# Patient Record
Sex: Male | Born: 1945 | Race: White | Hispanic: No | Marital: Married | Smoking: Never smoker
Health system: Southern US, Community
[De-identification: ages and names within clinical notes are randomized; demographics above are authoritative.]

## PROBLEM LIST (undated history)

## (undated) DIAGNOSIS — M1990 Unspecified osteoarthritis, unspecified site: Secondary | ICD-10-CM

## (undated) DIAGNOSIS — N2889 Other specified disorders of kidney and ureter: Secondary | ICD-10-CM

## (undated) DIAGNOSIS — K219 Gastro-esophageal reflux disease without esophagitis: Secondary | ICD-10-CM

## (undated) HISTORY — PX: JOINT REPLACEMENT: SHX530

---

## 1990-10-23 HISTORY — PX: EYE SURGERY: SHX253

## 1999-12-05 ENCOUNTER — Other Ambulatory Visit: Admission: RE | Admit: 1999-12-05 | Discharge: 1999-12-05 | Payer: Self-pay | Admitting: Orthopedic Surgery

## 2000-05-30 ENCOUNTER — Ambulatory Visit (HOSPITAL_BASED_OUTPATIENT_CLINIC_OR_DEPARTMENT_OTHER): Admission: RE | Admit: 2000-05-30 | Discharge: 2000-05-30 | Payer: Self-pay | Admitting: Orthopedic Surgery

## 2000-05-30 HISTORY — PX: OLECRANON BURSECTOMY: SHX2097

## 2000-07-11 ENCOUNTER — Ambulatory Visit (HOSPITAL_BASED_OUTPATIENT_CLINIC_OR_DEPARTMENT_OTHER): Admission: RE | Admit: 2000-07-11 | Discharge: 2000-07-11 | Payer: Self-pay | Admitting: Orthopedic Surgery

## 2000-07-11 ENCOUNTER — Encounter (INDEPENDENT_AMBULATORY_CARE_PROVIDER_SITE_OTHER): Payer: Self-pay | Admitting: *Deleted

## 2000-07-11 HISTORY — PX: MASS EXCISION: SHX2000

## 2001-06-21 ENCOUNTER — Encounter: Payer: Self-pay | Admitting: Orthopedic Surgery

## 2001-06-21 ENCOUNTER — Encounter: Admission: RE | Admit: 2001-06-21 | Discharge: 2001-06-21 | Payer: Self-pay | Admitting: Orthopedic Surgery

## 2002-04-10 ENCOUNTER — Encounter: Payer: Self-pay | Admitting: Orthopedic Surgery

## 2002-04-14 ENCOUNTER — Inpatient Hospital Stay (HOSPITAL_COMMUNITY): Admission: RE | Admit: 2002-04-14 | Discharge: 2002-04-18 | Payer: Self-pay | Admitting: Orthopedic Surgery

## 2002-04-14 ENCOUNTER — Encounter: Payer: Self-pay | Admitting: Orthopedic Surgery

## 2009-04-21 ENCOUNTER — Inpatient Hospital Stay (HOSPITAL_COMMUNITY): Admission: RE | Admit: 2009-04-21 | Discharge: 2009-04-23 | Payer: Self-pay | Admitting: Orthopedic Surgery

## 2011-01-30 LAB — BASIC METABOLIC PANEL
BUN: 10 mg/dL (ref 6–23)
BUN: 11 mg/dL (ref 6–23)
CO2: 30 mEq/L (ref 19–32)
Calcium: 7.8 mg/dL — ABNORMAL LOW (ref 8.4–10.5)
Chloride: 102 mEq/L (ref 96–112)
Creatinine, Ser: 1.05 mg/dL (ref 0.4–1.5)
Creatinine, Ser: 1.09 mg/dL (ref 0.4–1.5)
Glucose, Bld: 164 mg/dL — ABNORMAL HIGH (ref 70–99)

## 2011-01-30 LAB — URINALYSIS, ROUTINE W REFLEX MICROSCOPIC
Glucose, UA: NEGATIVE mg/dL
Nitrite: NEGATIVE
pH: 7.5 (ref 5.0–8.0)

## 2011-01-30 LAB — COMPREHENSIVE METABOLIC PANEL
ALT: 19 U/L (ref 0–53)
AST: 22 U/L (ref 0–37)
Albumin: 3.7 g/dL (ref 3.5–5.2)
Calcium: 9.2 mg/dL (ref 8.4–10.5)
GFR calc Af Amer: >60 mL/min (ref >60)
Sodium: 140 mEq/L (ref 135–145)
Total Protein: 6.6 g/dL (ref 6.0–8.3)

## 2011-01-30 LAB — PROTIME-INR
INR: 1.4 (ref 0.00–1.49)
Prothrombin Time: 17.3 seconds — ABNORMAL HIGH (ref 11.6–15.2)
Prothrombin Time: 21.8 seconds — ABNORMAL HIGH (ref 11.6–15.2)

## 2011-01-30 LAB — TYPE AND SCREEN: ABO/RH(D): B POS

## 2011-01-30 LAB — CBC
Hemoglobin: 12 g/dL — ABNORMAL LOW (ref 13.0–17.0)
MCHC: 34.7 g/dL (ref 30.0–36.0)
MCHC: 34.8 g/dL (ref 30.0–36.0)
MCHC: 35.7 g/dL (ref 30.0–36.0)
MCV: 95.6 fL (ref 78.0–100.0)
Platelets: 132 10*3/uL — ABNORMAL LOW (ref 150–400)
Platelets: 150 10*3/uL (ref 150–400)
RBC: 4.68 MIL/uL (ref 4.22–5.81)
RDW: 12.4 % (ref 11.5–15.5)
RDW: 12.5 % (ref 11.5–15.5)
WBC: 9.4 10*3/uL (ref 4.0–10.5)

## 2011-01-30 LAB — ABO/RH: ABO/RH(D): B POS

## 2011-03-07 NOTE — Op Note (Signed)
[name], [name] NO.:  [id number]   MEDICAL RECORD NO.:  [id number]          PATIENT TYPE:  INP   LOCATION:  5022                         FACILITY:  [facility]   PHYSICIAN:  Loreta Ave, M.D. DATE OF BIRTH:  November 07, 1945   DATE OF PROCEDURE:  04/21/2009  DATE OF DISCHARGE:                               OPERATIVE REPORT   PREOPERATIVE DIAGNOSIS:  Underlying rheumatoid arthritis with end-stage  arthritis, left hip.   POSTOPERATIVE DIAGNOSIS:  Underlying rheumatoid arthritis with end-stage  arthritis, left hip with marked residual synovitis and loose bodies.   PROCEDURE:  Left total hip replacement utilizing Stryker prosthesis.  A  58-mm acetabular component screw fixation x2, ceramic liner, 38-mm  internal diameter.  A Press-Fit #8 rejuvenate stem.  A 34-mm neck with 8  degrees of anteversion.  A 36-mm +5 ceramic head.   SURGEON:  Loreta Ave, MD   ASSISTANT:  Genene Churn. Barry Dienes, [place], present throughout the entire case  necessary for timely completion of procedure.   ANESTHESIA:  General.   BLOOD LOSS:  500 mL.   BLOOD GIVEN:  None.   SPECIMENS:  None.   CULTURES:  None.   COMPLICATIONS:  None.   DRESSING:  Soft compressive with abduction pillow.   PROCEDURE:  The patient was brought to the operating room and placed on  the operating table in the supine position.  After adequate anesthesia  had been obtained, turned to a lateral position.  Appropriate padding  support.  Prepped and draped in the usual sterile fashion.  About a  centimeter short on the left compared right from wear.  He already had a  hip replacement on the opposite side.  After being prepped and draped in  the usual sterile fashion, lateral approach incision along the femur  extending posterosuperior.  Skin and subcutaneous tissue were divided.  Iliotibial band was exposed and incised.  Charnley retractor was put in  place.  External rotator capsule was taken down off the back of  the  intertrochanteric groove of the femur.  Marked vascular hypertrophic  synovitis, Rice bodies, inflammatory tissue throughout the entire hip.  After the cast was taken down, debris was cleared out and the hip  dislocated posteriorly.  Marked grade 4 changes.  Femoral neck cut 1  fingerbreadth above the lesser trochanter in line with the definitive  component.  Acetabulum was exposed.  Grade 4 change throughout.  Appropriate reaming up to good bleeding bone, sufficient inferomedial  placement.  The natural acetabulum was tended to be tilted with very  little anteversion.  This was incised and fitted with a 58-mm component,  restoring as much anteversion as possible about 15 degrees with 45  degrees of abduction.  Sufficient covering stability and fixation with a  58-mm cup.  After it was seated, it was augmented fixation with 2 screws  through the cup.  Fitted with a 36-mm internal diameter ceramic liner.  Attention was turned to the femur.  Good fitting, sizing, and capturing  with the #8 rejuvenate stem.  After appropriate trials, the definitive  stem was seated.  With the 34-mm neck at 8 degrees of anteversion and  the 36-mm +5 head, I had excellent equalization of leg lengths with good  stability in flexion/extension.  Once the hip was reduced, I thoroughly  irrigated.  External rotator and capsule were repaired of the back of  the intertrochanteric groove with FiberWire, tied over bony bridge.  Wound was irrigated.  Iliotibial band was closed after Charnley  retractor was removed.  Skin and subcutaneous tissue were closed with  Vicryl and staples.  Margins were injected with Marcaine.  Sterile  compressive dressing was applied.  Returned in supine position.  Anesthesia was reversed.  Abduction pillow was placed.  Brought to the  recovery room.  Tolerated the surgery well.  No complications.      Loreta Ave, M.D.  Electronically Signed     DFM/MEDQ  D:  04/21/2009  T:   04/22/2009  Job:  [id number]

## 2011-03-10 NOTE — Op Note (Signed)
[place]. [facility]  Patient:    Adam [name], Adam [name] Visit Number: [id number] MRN: [id number]          Service Type: SUR Location: [id number] Attending Physician:  Colbert Ewing Dictated by:   Loreta Ave, M.D. Proc. Date: 04/14/02 Admit Date:  04/14/2002                             Operative Report  PREOPERATIVE DIAGNOSIS:  Degenerative arthritis of right hip.  POSTOPERATIVE DIAGNOSIS:  Degenerative arthritis of right hip.  OPERATIVE PROCEDURE:  Right total hip replacement utilizing Osteonics prosthesis, 58 mm acetabular component with a 36 mm internal diameter ceramic liner, screw fixation on the cuff x2, #9 femoral component, Secur-Fit Plus-type with 13 mm distal stem, 36 mm +0 femoral head, size G.  SURGEON:  Loreta Ave, M.D.  ASSISTANT:  Arlys John D. Petrarca, P.A.-C.  ANESTHESIA:  General.  ESTIMATED BLOOD LOSS:  400 cc.  BLOOD GIVEN:  None.  SPECIMENS:  Excised bone and soft tissue.  CULTURES:  None.  COMPLICATIONS:  None.  DRESSING:  Self-compressive with abduction pillow.  DESCRIPTION OF PROCEDURE:  The patient was brought to the operating room and placed on the operating table in the supine position.  After adequate anesthesia had been obtained, turned to the left lateral position with the left side up.  Prepped and draped in the usual sterile fashion.  A longitudinal incision over the shaft of the femur, extending posterior-superior off the trochanter.  Skin and subcutaneous tissue divided. Hemostasis with cautery.  The iliotibial band incised.  Charnley retractor put in place.  Sciatic nerve identified and protected.  External rotator and capsule taken down off the back of the intertrochanteric groove on the femur and tagged with a #1 Ethibond.  Hip dislocated posteriorly.  Grade 4 changes throughout.  Femoral head removed with a saw in line with the definitive prosthesis, one fingerbreadth above the lesser  trochanter.  Retractors placed. Stab wound exposed.  Soft tissue debrided clear.  Grade 4 changes.  Sequential reaming up to a nice bleeding bony base with appropriate medial and inferior placement.  Sized for a 58 mm component.  A 58 mm component was hammered in place, setting 45 degrees of abduction and 20 degrees of anteversion.  Screw fixation x2, 20 mm above and 16 mm posterosuperior and placed through the cuff.  The ceramic size G, 58 mm external diameter, and 36 mm internal diameter ceramic insert was placed in the acetabulum, and firmly screwed in place.  External alignment and fixation.  Femur exposed.  Sequential reaming proximal and distal with the hand-held and proximal reamers and broaches. Sized to a #9 component with a 13 mm distal stem.  Trials were then undertaken with that size component.  With the femoral head in place, the optimal position was with a #5, 36 mm size G head.  Had excellent stability in flexion and extension.  Good restoration of leg lengths and without undue tightness. All trials were removed.  Definitive component was then opened and hammered down and placed in the femur with a #9 Secur-Fit Plus 13 mm distal stem.  The +5 ceramic 36 mm head was then attached to the femoral component and the hip reduced.  Excellent stability.  Good motion, flexion and extension.  Good restoration of leg lengths.  The wound was irrigated.  External rotator and capsule were repaired on the back of the  intertrochanteric groove with sutures passed through bony tunnels and tied over a bony bridge to restore the posterior wall of the hip.  The wound irrigated.  Sciatic nerve identified and was intact.  Charnley retractor was removed.  The iliotibial band closed with #1 Vicryl, the skin and subcutaneous tissue with Vicryl and staples.  Margins of the wound injected with Marcaine.  A sterile compressive dressing applied. Returned to the supine position.  Abduction pillow placed.   Anesthesia reversed.  Brought to the recovery room.  Tolerated the surgery well with no complications. Dictated by:   Loreta Ave, M.D. Attending Physician:  Colbert Ewing DD:  04/16/02 TD:  04/18/02 Job: [id number]

## 2011-03-10 NOTE — Discharge Summary (Signed)
NAME:  Adam [name], Adam [name] NO.:  [id number]   MEDICAL RECORD NO.:  [id number]                   PATIENT TYPE:  NP   LOCATION:  5012                                 FACILITY:  [facility]   PHYSICIAN:  Loreta Ave, M.D.              DATE OF BIRTH:  08/21/1946   DATE OF ADMISSION:  04/14/2002  DATE OF DISCHARGE:  04/18/2002                                 DISCHARGE SUMMARY   ADMISSION DIAGNOSIS:  Advanced degenerative joint disease of right hip.   DISCHARGE DIAGNOSIS:  Advanced degenerative joint disease of right hip.   PROCEDURE:  Right total hip replacement.   HISTORY OF PRESENT ILLNESS:  A 65 year old male with history of gradual  onset of right hip pain.  He does have a history of rheumatoid arthritis  treated with prednisone and then sulfasalazine and finally off to Advil  only.  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  He has  had progressive mechanical symptoms in the right hip, now showing end stage  DJD, indicated for right total hip replacement.   HOSPITAL COURSE:  A 65 year old male admitted April 14, 2002, after  appropriate laboratory studies were obtained, as well as 1 gm of Ancef IV on  call to the operating room and was taken to the operating room where he  underwent a right total hip replacement.  He tolerated the procedure well.  The patient was with a Dilaudid PCA pump postoperatively.  Heparin 5,000  subcu q.12h. until his Coumadin became therapeutic.  Consultations with PT  and OT were ordered.  The patient was allowed to ambulate touch down weight  bearing 5 lbs only on the right with his walker/crutches.  Hemoglobin A1c  was also ordered.  He was brought out of bed to a chair the following day.   On the 24th he had his IV heplocked, and he was weaned to oral pain  medicines.  IV was discontinued on the 25th, had some difficulty with  constipation, but this improved.  He was discharged on the 27th to return  back  to the office in 10 days for followup.  EKG reveals normal sinus  rhythm.  Radiographic studies of June 23 revealed portable right hip showing  good position and alignment of the prosthesis.  Preop hemoglobin was 15.3,  hematocrit 44.8%, white count 6,700, platelets 123,000.  Discharge  hemoglobin 12.5, hematocrit 36.2%.   Preop chemistry revealed a sodium 139, potassium 4.4, chloride 104, CO2 of  25, glucose 145, BUN 18, creatinine 1.1, calcium 9.3, total protein 6.9,  albumin 3.5, AST 22, ALT 18, ALP 66, and total bilirubin 1.1.  Discharge  sodium 137, potassium 4.0, chloride 100, CO2 of 30, glucose 112, BUN 11,  creatinine 0.9, calcium 8.4, glycosylated hemoglobin 5.1 and normal.  Urinalysis was benign.  Urine blood type B+, antibody screen negative.   DISCHARGE INSTRUCTIONS:  Given a prescription  of Percocet 5/325 one to two  tabs q.4h. p.r.n. pain, Coumadin as per Genevieve Norlander pharmacist, Colace 100 mg  twice a day, iron sulfate 325 mg daily.  He is allowed to be up as taught in  physical therapy.  No restrictions in diet at this time, keep his wound  clean and dry, may shower and  change his dressing daily, call if he has any redness, drainage from the  incision, call if any temperature elevation above 101 degrees.  He will  follow back up with the doctor in 10 days in the office for staple removal.  Discharged in improved condition.     Oris Drone Petrarca, P.A.-C.                Loreta Ave, M.D.    BDP/MEDQ  D:  05/17/2002  T:  05/25/2002  Job:  [id number]   cc:   Loreta Ave, M.D.

## 2011-03-10 NOTE — Op Note (Signed)
[place]. [facility]  Patient:    Adam [name], Adam [name]                     MRN: [id number] Proc. Date: 05/30/00 Adm. Date:  [id number] Attending:  Marlowe Shores                           Operative Report  PREOPERATIVE DIAGNOSIS:  Chronic olecranon bursitis, right elbow.  POSTOPERATIVE DIAGNOSIS:  Chronic olecranon bursitis, right elbow.  PROCEDURE:  Excision of chronic olecranon bursitis, right elbow.  SURGEON:  Artist Pais. Mina Marble, M.D.  ANESTHESIA:  General.  TOURNIQUET TIME:  28 minutes.  COMPLICATIONS:  None.  DRAINS:  None.  DESCRIPTION OF PROCEDURE:  The patient was taken to the operating room.  After the induction of adequate general anesthesia, the right upper extremity was prepped and draped in the usual sterile fashion.  An Esmarch was used to exsanguinate the limb and a tourniquet was inflated to 250 mmHg.  At this point in time, a 4.5 to 5 cm incision was made over the olecranon process of the right elbow.  The incision was taken down through the skin and subcutaneous tissues until large, chronically inflamed bursa was identified. The bursa was carefully dissected free from the underlying subcutaneous tissues using a #15 blade and traced down to an origin overlying the olecranon itself.  The bursa was removed in its entirety and sent for pathologic confirmation.  There were some small rheumatoid type nodules surrounding in the soft tissues.  These were carefully excised and sent for pathologic confirmation, as well.  The wound was then thoroughly irrigated.  Hemostasis was achieved with bipolar cautery and was then closed with 4-0 nylon in horizontal mattress sutures x 7.  A sterile dressing of Xeroform, 4 x 4 fluffs, Kling and compressive type dressing was applied.  The patient tolerated the procedure well and went to the recovery room in stable. DD:  05/30/00 TD:  05/30/00 Job: [id number]

## 2011-03-10 NOTE — Op Note (Signed)
[place]. [facility]  Patient:    Adam [name], Adam [name]                     MRN: [id number] Proc. Date: 07/11/00 Adm. Date:  [id number] Attending:  Marlowe Shores CC:         Artist Pais Mina Marble, M.D.  (Office, 2)                           Operative Report  PREOPERATIVE DIAGNOSIS:  Mass, left elbow, olecranon area.  POSTOPERATIVE DIAGNOSIS:  Mass, left elbow, olecranon area.  OPERATION PERFORMED:  Excisional biopsy, mass, left elbow.  SURGEON:  Artist Pais. Mina Marble, M.D.  ASSISTANT:  Junius Roads. [place], P.A.C.  ANESTHESIA:  General.  TOURNIQUET TIME:  Thirty minutes.  COMPLICATIONS:  None.  DRAINS:  None.  SPECIMEN:  One specimen sent.  DESCRIPTION OF PROCEDURE:  The patient was brought to the operating room. After the induction of adequate general anesthesia the left upper extremity was prepped and draped in the usual sterile fashion.  An Esmarch was used to exsanguinate the limb.  Tourniquet was inflated to 250 mmHg.  At this point in time a longitudinal incision was made over the olecranon fossa of the left elbow.  The incision was taken down through the skin and subcutaneous tissues until the mass was identified.  The mass was carefully dissected down to its stalk, which appeared to be coming from the proximal ulna.  This was removed in its entirety and sent for pathologic confirmation.  The wound was then thoroughly irrigated and closed with a 4-0 nylon in a combination of simple and horizontal mattress sutures.  A sterile dressing of Xeroform, 4 x 4s, fluffs and a compressive dressing was applied.  The patient tolerated the procedure well and went to recovery room in stable fashion. DD:  07/11/00 TD:  07/11/00 Job: [id number]

## 2018-02-01 ENCOUNTER — Other Ambulatory Visit: Payer: Self-pay | Admitting: Urology

## 2018-02-13 ENCOUNTER — Encounter (HOSPITAL_COMMUNITY): Payer: Self-pay

## 2018-02-13 NOTE — Patient Instructions (Signed)
Adam [name]  02/13/2018   Your procedure is scheduled on: 02-15-18   Report to [facility] Main  Entrance    Report to admitting at 5:30AM    Call this number if you have problems the morning of surgery [phone]     Remember: Do not eat food or drink liquids :After Midnight.     Take these medicines the morning of surgery with A SIP OF WATER: NONE                                  You may not have any metal on your body including hair pins and              piercings  Do not wear jewelry, make-up, lotions, powders or perfumes, deodorant             Do not wear nail polish.  Do not shave  48 hours prior to surgery.              Men may shave face and neck.   Do not bring valuables to the hospital. [place].  Contacts, dentures or bridgework may not be worn into surgery.  Leave suitcase in the car. After surgery it may be brought to your room.                 Please read over the following fact sheets you were given: _____________________________________________________________________             [facility] - Preparing for Surgery Before surgery, you can play an important role.  Because skin is not sterile, your skin needs to be as free of germs as possible.  You can reduce the number of germs on your skin by washing with CHG (chlorahexidine gluconate) soap before surgery.  CHG is an antiseptic cleaner which kills germs and bonds with the skin to continue killing germs even after washing. Please DO NOT use if you have an allergy to CHG or antibacterial soaps.  If your skin becomes reddened/irritated stop using the CHG and inform your nurse when you arrive at Short Stay. Do not shave (including legs and underarms) for at least 48 hours prior to the first CHG shower.  You may shave your face/neck. Please follow these instructions carefully:  1.  Shower with CHG Soap the night before surgery and the   morning of Surgery.  2.  If you choose to wash your hair, wash your hair first as usual with your  normal  shampoo.  3.  After you shampoo, rinse your hair and body thoroughly to remove the  shampoo.                           4.  Use CHG as you would any other liquid soap.  You can apply chg directly  to the skin and wash                       Gently with a scrungie or clean washcloth.  5.  Apply the CHG Soap to your body ONLY FROM THE NECK DOWN.   Do not use on face/ open  Wound or open sores. Avoid contact with eyes, ears mouth and genitals (private parts).                       Wash face,  Genitals (private parts) with your normal soap.             6.  Wash thoroughly, paying special attention to the area where your surgery  will be performed.  7.  Thoroughly rinse your body with warm water from the neck down.  8.  DO NOT shower/wash with your normal soap after using and rinsing off  the CHG Soap.                9.  Pat yourself dry with a clean towel.            10.  Wear clean pajamas.            11.  Place clean sheets on your bed the night of your first shower and do not  sleep with pets. Day of Surgery : Do not apply any lotions/deodorants the morning of surgery.  Please wear clean clothes to the hospital/surgery center.  FAILURE TO FOLLOW THESE INSTRUCTIONS MAY RESULT IN THE CANCELLATION OF YOUR SURGERY PATIENT SIGNATURE_________________________________  NURSE SIGNATURE__________________________________  ________________________________________________________________________  WHAT IS A BLOOD TRANSFUSION? Blood Transfusion Information  A transfusion is the replacement of blood or some of its parts. Blood is made up of multiple cells which provide different functions.  Red blood cells carry oxygen and are used for blood loss replacement.  White blood cells fight against infection.  Platelets control bleeding.  Plasma helps clot blood.  Other blood  products are available for specialized needs, such as hemophilia or other clotting disorders. BEFORE THE TRANSFUSION  Who gives blood for transfusions?   Healthy volunteers who are fully evaluated to make sure their blood is safe. This is blood bank blood. Transfusion therapy is the safest it has ever been in the practice of medicine. Before blood is taken from a donor, a complete history is taken to make sure that person has no history of diseases nor engages in risky social behavior (examples are intravenous drug use or sexual activity with multiple partners). The donor's travel history is screened to minimize risk of transmitting infections, such as malaria. The donated blood is tested for signs of infectious diseases, such as HIV and hepatitis. The blood is then tested to be sure it is compatible with you in order to minimize the chance of a transfusion reaction. If you or a relative donates blood, this is often done in anticipation of surgery and is not appropriate for emergency situations. It takes many days to process the donated blood. RISKS AND COMPLICATIONS Although transfusion therapy is very safe and saves many lives, the main dangers of transfusion include:   Getting an infectious disease.  Developing a transfusion reaction. This is an allergic reaction to something in the blood you were given. Every precaution is taken to prevent this. The decision to have a blood transfusion has been considered carefully by your caregiver before blood is given. Blood is not given unless the benefits outweigh the risks. AFTER THE TRANSFUSION  Right after receiving a blood transfusion, you will usually feel much better and more energetic. This is especially true if your red blood cells have gotten low (anemic). The transfusion raises the level of the red blood cells which carry oxygen, and this usually causes an energy increase.  The  nurse administering the transfusion will monitor you carefully for  complications. HOME CARE INSTRUCTIONS  No special instructions are needed after a transfusion. You may find your energy is better. Speak with your caregiver about any limitations on activity for underlying diseases you may have. SEEK MEDICAL CARE IF:   Your condition is not improving after your transfusion.  You develop redness or irritation at the intravenous (IV) site. SEEK IMMEDIATE MEDICAL CARE IF:  Any of the following symptoms occur over the next 12 hours:  Shaking chills.  You have a temperature by mouth above 102 F (38.9 C), not controlled by medicine.  Chest, back, or muscle pain.  People around you feel you are not acting correctly or are confused.  Shortness of breath or difficulty breathing.  Dizziness and fainting.  You get a rash or develop hives.  You have a decrease in urine output.  Your urine turns a dark color or changes to pink, red, or brown. Any of the following symptoms occur over the next 10 days:  You have a temperature by mouth above 102 F (38.9 C), not controlled by medicine.  Shortness of breath.  Weakness after normal activity.  The white part of the eye turns yellow (jaundice).  You have a decrease in the amount of urine or are urinating less often.  Your urine turns a dark color or changes to pink, red, or brown. Document Released: 10/06/2000 Document Revised: 01/01/2012 Document Reviewed: 05/25/2008 [facility] Patient Information 2014 [place], [place].  _______________________________________________________________________

## 2018-02-14 ENCOUNTER — Encounter (HOSPITAL_COMMUNITY): Payer: Self-pay

## 2018-02-14 ENCOUNTER — Other Ambulatory Visit: Payer: Self-pay

## 2018-02-14 ENCOUNTER — Encounter (HOSPITAL_COMMUNITY)
Admission: RE | Admit: 2018-02-14 | Discharge: 2018-02-14 | Disposition: A | Discharge status: Home / Self Care | Payer: Medicare Other | Source: Ambulatory Visit | Attending: Urology | Admitting: Urology

## 2018-02-14 HISTORY — DX: Gastro-esophageal reflux disease without esophagitis: K21.9

## 2018-02-14 HISTORY — DX: Other specified disorders of kidney and ureter: N28.89

## 2018-02-14 HISTORY — DX: Unspecified osteoarthritis, unspecified site: M19.90

## 2018-02-14 LAB — COMPREHENSIVE METABOLIC PANEL
ALK PHOS: 59 U/L (ref 38–126)
ALT: 22 U/L (ref 17–63)
ANION GAP: 10 (ref 5–15)
AST: 27 U/L (ref 15–41)
Albumin: 4.1 g/dL (ref 3.5–5.0)
BUN: 22 mg/dL — ABNORMAL HIGH (ref 6–20)
CALCIUM: 8.8 mg/dL — AB (ref 8.9–10.3)
CO2: 24 mmol/L (ref 22–32)
CREATININE: 1.38 mg/dL — AB (ref 0.61–1.24)
Chloride: 104 mmol/L (ref 101–111)
GFR, EST AFRICAN AMERICAN: 58 mL/min — AB (ref >60)
GFR, EST NON AFRICAN AMERICAN: 50 mL/min — AB (ref >60)
Glucose, Bld: 101 mg/dL — ABNORMAL HIGH (ref 65–99)
Potassium: 4.4 mmol/L (ref 3.5–5.1)
SODIUM: 138 mmol/L (ref 135–145)
Total Bilirubin: 1.3 mg/dL — ABNORMAL HIGH (ref 0.3–1.2)
Total Protein: 7.4 g/dL (ref 6.5–8.1)

## 2018-02-14 LAB — CBC
HCT: 47 % (ref 39.0–52.0)
Hemoglobin: 16.8 g/dL (ref 13.0–17.0)
MCH: 33.7 pg (ref 26.0–34.0)
MCHC: 35.7 g/dL (ref 30.0–36.0)
MCV: 94.2 fL (ref 78.0–100.0)
PLATELETS: 185 10*3/uL (ref 150–400)
RBC: 4.99 MIL/uL (ref 4.22–5.81)
RDW: 12.7 % (ref 11.5–15.5)
WBC: 8.1 10*3/uL (ref 4.0–10.5)

## 2018-02-15 ENCOUNTER — Encounter (HOSPITAL_COMMUNITY): Payer: Self-pay | Admitting: Emergency Medicine

## 2018-02-15 ENCOUNTER — Encounter (HOSPITAL_COMMUNITY)
Admission: RE | Disposition: A | Discharge status: Home / Self Care | Payer: Self-pay | Source: Ambulatory Visit | Attending: Urology

## 2018-02-15 ENCOUNTER — Inpatient Hospital Stay (HOSPITAL_COMMUNITY): Payer: Medicare Other | Admitting: Certified Registered Nurse Anesthetist

## 2018-02-15 ENCOUNTER — Inpatient Hospital Stay (HOSPITAL_COMMUNITY)
Admission: RE | Admit: 2018-02-15 | Discharge: 2018-02-17 | DRG: 658 | Disposition: A | Discharge status: Home / Self Care | Payer: Medicare Other | Source: Ambulatory Visit | Attending: Urology | Admitting: Urology

## 2018-02-15 DIAGNOSIS — M1990 Unspecified osteoarthritis, unspecified site: Secondary | ICD-10-CM | POA: Diagnosis present

## 2018-02-15 DIAGNOSIS — Z6835 Body mass index (BMI) 35.0-35.9, adult: Secondary | ICD-10-CM

## 2018-02-15 DIAGNOSIS — N2889 Other specified disorders of kidney and ureter: Secondary | ICD-10-CM | POA: Diagnosis present

## 2018-02-15 DIAGNOSIS — E669 Obesity, unspecified: Secondary | ICD-10-CM | POA: Diagnosis present

## 2018-02-15 DIAGNOSIS — D49519 Neoplasm of unspecified behavior of unspecified kidney: Secondary | ICD-10-CM | POA: Diagnosis present

## 2018-02-15 DIAGNOSIS — C641 Malignant neoplasm of right kidney, except renal pelvis: Secondary | ICD-10-CM | POA: Diagnosis present

## 2018-02-15 HISTORY — PX: LAPAROSCOPIC NEPHRECTOMY, HAND ASSISTED: SHX1929

## 2018-02-15 LAB — BASIC METABOLIC PANEL
Anion gap: 10 (ref 5–15)
BUN: 20 mg/dL (ref 6–20)
CALCIUM: 8.2 mg/dL — AB (ref 8.9–10.3)
CHLORIDE: 105 mmol/L (ref 101–111)
CO2: 24 mmol/L (ref 22–32)
CREATININE: 1.54 mg/dL — AB (ref 0.61–1.24)
GFR calc non Af Amer: 44 mL/min — ABNORMAL LOW (ref >60)
GFR, EST AFRICAN AMERICAN: 51 mL/min — AB (ref >60)
Glucose, Bld: 183 mg/dL — ABNORMAL HIGH (ref 65–99)
Potassium: 4.2 mmol/L (ref 3.5–5.1)
SODIUM: 139 mmol/L (ref 135–145)

## 2018-02-15 LAB — CBC
HCT: 43.8 % (ref 39.0–52.0)
Hemoglobin: 15.1 g/dL (ref 13.0–17.0)
MCH: 32.9 pg (ref 26.0–34.0)
MCHC: 34.5 g/dL (ref 30.0–36.0)
MCV: 95.4 fL (ref 78.0–100.0)
PLATELETS: 167 10*3/uL (ref 150–400)
RBC: 4.59 MIL/uL (ref 4.22–5.81)
RDW: 12.8 % (ref 11.5–15.5)
WBC: 13 10*3/uL — AB (ref 4.0–10.5)

## 2018-02-15 LAB — TYPE AND SCREEN
ABO/RH(D): B POS
ANTIBODY SCREEN: NEGATIVE

## 2018-02-15 LAB — ABO/RH: ABO/RH(D): B POS

## 2018-02-15 SURGERY — NEPHRECTOMY, HAND-ASSISTED, LAPAROSCOPIC
Anesthesia: General | Laterality: Right

## 2018-02-15 MED ORDER — FENTANYL CITRATE (PF) 250 MCG/5ML IJ SOLN
INTRAMUSCULAR | Status: AC
  Filled 2018-02-15: qty 5

## 2018-02-15 MED ORDER — DOCUSATE SODIUM 100 MG PO CAPS
100.0000 mg | ORAL_CAPSULE | Freq: Two times a day (BID) | ORAL | Status: DC
  Administered 2018-02-15 – 2018-02-17 (×4): 100 mg via ORAL
  Filled 2018-02-15 (×4): qty 1

## 2018-02-15 MED ORDER — ZOLPIDEM TARTRATE 5 MG PO TABS: 5.0000 mg | ORAL_TABLET | Freq: Every evening | ORAL | Status: DC | PRN

## 2018-02-15 MED ORDER — DEXAMETHASONE SODIUM PHOSPHATE 10 MG/ML IJ SOLN
INTRAMUSCULAR | Status: DC | PRN
  Administered 2018-02-15: 5 mg via INTRAVENOUS

## 2018-02-15 MED ORDER — SUGAMMADEX SODIUM 200 MG/2ML IV SOLN
INTRAVENOUS | Status: DC | PRN
  Administered 2018-02-15: 200 mg via INTRAVENOUS

## 2018-02-15 MED ORDER — SODIUM CHLORIDE 0.9 % IJ SOLN
INTRAMUSCULAR | Status: DC | PRN
  Administered 2018-02-15: 40 mL

## 2018-02-15 MED ORDER — SODIUM CHLORIDE 0.9 % IV SOLN
INTRAVENOUS | Status: DC
  Administered 2018-02-15 – 2018-02-16 (×2): via INTRAVENOUS

## 2018-02-15 MED ORDER — ONDANSETRON HCL 4 MG/2ML IJ SOLN
INTRAMUSCULAR | Status: AC
  Filled 2018-02-15: qty 2

## 2018-02-15 MED ORDER — SODIUM CHLORIDE 0.9 % IJ SOLN
INTRAMUSCULAR | Status: AC
  Filled 2018-02-15: qty 50

## 2018-02-15 MED ORDER — ACETAMINOPHEN 10 MG/ML IV SOLN
1000.0000 mg | Freq: Four times a day (QID) | INTRAVENOUS | Status: AC
  Administered 2018-02-15 – 2018-02-16 (×4): 1000 mg via INTRAVENOUS
  Filled 2018-02-15 (×3): qty 100

## 2018-02-15 MED ORDER — ONDANSETRON HCL 4 MG/2ML IJ SOLN
INTRAMUSCULAR | Status: DC | PRN
  Administered 2018-02-15: 4 mg via INTRAVENOUS

## 2018-02-15 MED ORDER — FENTANYL CITRATE (PF) 100 MCG/2ML IJ SOLN
INTRAMUSCULAR | Status: AC
  Filled 2018-02-15: qty 2

## 2018-02-15 MED ORDER — SUCCINYLCHOLINE CHLORIDE 20 MG/ML IJ SOLN
INTRAMUSCULAR | Status: DC | PRN
Start: 2018-02-15 — End: 2018-02-15
  Administered 2018-02-15: 120 mg via INTRAVENOUS

## 2018-02-15 MED ORDER — ROCURONIUM BROMIDE 10 MG/ML (PF) SYRINGE
PREFILLED_SYRINGE | INTRAVENOUS | Status: DC | PRN
  Administered 2018-02-15: 20 mg via INTRAVENOUS
  Administered 2018-02-15: 10 mg via INTRAVENOUS
  Administered 2018-02-15: 20 mg via INTRAVENOUS
  Administered 2018-02-15: 50 mg via INTRAVENOUS

## 2018-02-15 MED ORDER — ACETAMINOPHEN 500 MG PO TABS
1000.0000 mg | ORAL_TABLET | Freq: Once | ORAL | Status: AC
  Administered 2018-02-15: 1000 mg via ORAL
  Filled 2018-02-15: qty 2

## 2018-02-15 MED ORDER — SUGAMMADEX SODIUM 200 MG/2ML IV SOLN
INTRAVENOUS | Status: AC
  Filled 2018-02-15: qty 2

## 2018-02-15 MED ORDER — KETOROLAC TROMETHAMINE 15 MG/ML IJ SOLN
15.0000 mg | Freq: Four times a day (QID) | INTRAMUSCULAR | Status: AC
  Administered 2018-02-15 (×3): 15 mg via INTRAVENOUS
  Filled 2018-02-15 (×2): qty 1

## 2018-02-15 MED ORDER — ACETAMINOPHEN 10 MG/ML IV SOLN
INTRAVENOUS | Status: AC
  Administered 2018-02-15: 1000 mg via INTRAVENOUS
  Filled 2018-02-15: qty 100

## 2018-02-15 MED ORDER — PROPOFOL 10 MG/ML IV BOLUS
INTRAVENOUS | Status: AC
  Filled 2018-02-15: qty 20

## 2018-02-15 MED ORDER — FENTANYL CITRATE (PF) 100 MCG/2ML IJ SOLN
25.0000 ug | INTRAMUSCULAR | Status: DC | PRN
  Administered 2018-02-15: 50 ug via INTRAVENOUS

## 2018-02-15 MED ORDER — DEXAMETHASONE SODIUM PHOSPHATE 10 MG/ML IJ SOLN
INTRAMUSCULAR | Status: AC
  Filled 2018-02-15: qty 1

## 2018-02-15 MED ORDER — CEFAZOLIN SODIUM-DEXTROSE 2-4 GM/100ML-% IV SOLN
2.0000 g | INTRAVENOUS | Status: AC
  Administered 2018-02-15: 2 g via INTRAVENOUS
  Filled 2018-02-15: qty 100

## 2018-02-15 MED ORDER — ONDANSETRON HCL 4 MG/2ML IJ SOLN: 4.0000 mg | Freq: Once | INTRAMUSCULAR | Status: DC | PRN

## 2018-02-15 MED ORDER — BUPIVACAINE-EPINEPHRINE 0.5% -1:200000 IJ SOLN
INTRAMUSCULAR | Status: DC | PRN
  Administered 2018-02-15: 30 mL

## 2018-02-15 MED ORDER — LACTATED RINGERS IV SOLN
INTRAVENOUS | Status: DC
  Administered 2018-02-15 (×3): via INTRAVENOUS

## 2018-02-15 MED ORDER — DOCUSATE SODIUM 100 MG PO CAPS: 100.0000 mg | ORAL_CAPSULE | Freq: Two times a day (BID) | ORAL | 0 refills | Status: AC

## 2018-02-15 MED ORDER — BUPIVACAINE-EPINEPHRINE (PF) 0.5% -1:200000 IJ SOLN
INTRAMUSCULAR | Status: AC
  Filled 2018-02-15: qty 30

## 2018-02-15 MED ORDER — BUPIVACAINE LIPOSOME 1.3 % IJ SUSP
20.0000 mL | Freq: Once | INTRAMUSCULAR | Status: AC
  Administered 2018-02-15: 20 mL
  Filled 2018-02-15: qty 20

## 2018-02-15 MED ORDER — ONDANSETRON HCL 4 MG/2ML IJ SOLN: 4.0000 mg | Freq: Four times a day (QID) | INTRAMUSCULAR | Status: DC | PRN

## 2018-02-15 MED ORDER — TRAMADOL HCL 50 MG PO TABS
50.0000 mg | ORAL_TABLET | Freq: Four times a day (QID) | ORAL | 0 refills | Status: AC | PRN
Start: 2018-02-15 — End: ?

## 2018-02-15 MED ORDER — HYDROMORPHONE HCL 1 MG/ML IJ SOLN
0.5000 mg | INTRAMUSCULAR | Status: DC | PRN
Start: 2018-02-15 — End: 2018-02-16

## 2018-02-15 MED ORDER — DEXMEDETOMIDINE HCL 200 MCG/2ML IV SOLN
INTRAVENOUS | Status: DC | PRN
  Administered 2018-02-15 (×2): 20 ug via INTRAVENOUS

## 2018-02-15 MED ORDER — LIDOCAINE 2% (20 MG/ML) 5 ML SYRINGE
INTRAMUSCULAR | Status: DC | PRN
  Administered 2018-02-15: 60 mg via INTRAVENOUS

## 2018-02-15 MED ORDER — LACTATED RINGERS IR SOLN
Status: DC | PRN
  Administered 2018-02-15: 1000 mL

## 2018-02-15 MED ORDER — TRAMADOL HCL 50 MG PO TABS
50.0000 mg | ORAL_TABLET | Freq: Four times a day (QID) | ORAL | Status: DC | PRN
  Administered 2018-02-16 – 2018-02-17 (×2): 100 mg via ORAL
  Filled 2018-02-15 (×2): qty 2

## 2018-02-15 MED ORDER — FENTANYL CITRATE (PF) 100 MCG/2ML IJ SOLN
INTRAMUSCULAR | Status: DC | PRN
  Administered 2018-02-15 (×2): 50 ug via INTRAVENOUS
  Administered 2018-02-15: 25 ug via INTRAVENOUS
  Administered 2018-02-15: 50 ug via INTRAVENOUS
  Administered 2018-02-15: 100 ug via INTRAVENOUS

## 2018-02-15 MED ORDER — KETOROLAC TROMETHAMINE 15 MG/ML IJ SOLN
INTRAMUSCULAR | Status: AC
  Administered 2018-02-15: 15 mg via INTRAVENOUS
  Filled 2018-02-15: qty 1

## 2018-02-15 SURGICAL SUPPLY — 59 items
APPLICATOR ARISTA FLEXITIP XL (MISCELLANEOUS) IMPLANT
APPLICATOR SURGIFLO ENDO (HEMOSTASIS) IMPLANT
APPLIER CLIP ROT 10 11.4 M/L (STAPLE)
BAG LAPAROSCOPIC 12 15 PORT 16 (BASKET) IMPLANT
BAG RETRIEVAL 12/15 (BASKET)
BAG ZIPLOCK 12X15 (MISCELLANEOUS) IMPLANT
BLADE EXTENDED COATED 6.5IN (ELECTRODE) ×2 IMPLANT
BLADE SURG SZ10 CARB STEEL (BLADE) ×2 IMPLANT
CHLORAPREP W/TINT 26ML (MISCELLANEOUS) ×2 IMPLANT
CLIP APPLIE ROT 10 11.4 M/L (STAPLE) IMPLANT
CLIP VESOLOCK LG 6/CT PURPLE (CLIP) ×2 IMPLANT
CLIP VESOLOCK MED LG 6/CT (CLIP) ×4 IMPLANT
CLIP VESOLOCK XL 6/CT (CLIP) IMPLANT
CUTTER FLEX LINEAR 45M (STAPLE) ×2 IMPLANT
DECANTER SPIKE VIAL GLASS SM (MISCELLANEOUS) IMPLANT
DERMABOND ADVANCED (GAUZE/BANDAGES/DRESSINGS) ×1
DERMABOND ADVANCED .7 DNX12 (GAUZE/BANDAGES/DRESSINGS) ×1 IMPLANT
DRAPE INCISE IOBAN 66X45 STRL (DRAPES) ×2 IMPLANT
DRAPE WARM FLUID 44X44 (DRAPE) IMPLANT
DRSG TEGADERM 2-3/8X2-3/4 SM (GAUZE/BANDAGES/DRESSINGS) ×6 IMPLANT
DRSG TEGADERM 4X4.75 (GAUZE/BANDAGES/DRESSINGS) ×2 IMPLANT
DRSG TELFA 3X8 NADH (GAUZE/BANDAGES/DRESSINGS) ×2 IMPLANT
ELECT PENCIL ROCKER SW 15FT (MISCELLANEOUS) ×2 IMPLANT
ELECT REM PT RETURN 15FT ADLT (MISCELLANEOUS) ×2 IMPLANT
GLOVE BIOGEL M STRL SZ7.5 (GLOVE) ×2 IMPLANT
GOWN STRL REUS W/TWL LRG LVL3 (GOWN DISPOSABLE) ×4 IMPLANT
HEMOSTAT ARISTA ABSORB 3G PWDR (MISCELLANEOUS) IMPLANT
HEMOSTAT SURGICEL 4X8 (HEMOSTASIS) ×2 IMPLANT
IRRIG SUCT STRYKERFLOW 2 WTIP (MISCELLANEOUS) ×2
IRRIGATION SUCT STRKRFLW 2 WTP (MISCELLANEOUS) ×1 IMPLANT
KIT BASIN OR (CUSTOM PROCEDURE TRAY) ×2 IMPLANT
MANIFOLD NEPTUNE II (INSTRUMENTS) ×2 IMPLANT
NEEDLE SPNL 22GX3.5 QUINCKE BK (NEEDLE) ×2 IMPLANT
PAD POSITIONING PINK XL (MISCELLANEOUS) ×2 IMPLANT
PAD TELFA 2X3 NADH STRL (GAUZE/BANDAGES/DRESSINGS) ×2 IMPLANT
POSITIONER SURGICAL ARM (MISCELLANEOUS) ×4 IMPLANT
RELOAD 45 VASCULAR/THIN (ENDOMECHANICALS) ×4 IMPLANT
RELOAD STAPLE TA45 3.5 REG BLU (ENDOMECHANICALS) IMPLANT
SCISSORS LAP 5X35 DISP (ENDOMECHANICALS) IMPLANT
SHEARS HARMONIC ACE PLUS 36CM (ENDOMECHANICALS) ×2 IMPLANT
SLEEVE XCEL OPT CAN 5 100 (ENDOMECHANICALS) ×2 IMPLANT
SPONGE LAP 4X18 RFD (DISPOSABLE) ×2 IMPLANT
STAPLER VISISTAT 35W (STAPLE) ×2 IMPLANT
SUT MNCRL AB 4-0 PS2 18 (SUTURE) ×4 IMPLANT
SUT PDS AB 0 CT1 36 (SUTURE) ×2 IMPLANT
SUT VIC AB 2-0 CT1 27 (SUTURE) ×1
SUT VIC AB 2-0 CT1 27XBRD (SUTURE) ×1 IMPLANT
SUT VIC AB 3-0 SH 27 (SUTURE) ×1
SUT VIC AB 3-0 SH 27XBRD (SUTURE) ×1 IMPLANT
SUT VICRYL 0 UR6 27IN ABS (SUTURE) ×4 IMPLANT
TAPE CLOTH 4X10 WHT NS (GAUZE/BANDAGES/DRESSINGS) IMPLANT
TOWEL OR 17X26 10 PK STRL BLUE (TOWEL DISPOSABLE) ×2 IMPLANT
TOWEL OR NON WOVEN STRL DISP B (DISPOSABLE) ×2 IMPLANT
TRAY FOLEY CATH 16FR SILVER (SET/KITS/TRAYS/PACK) ×2 IMPLANT
TRAY LAPAROSCOPIC (CUSTOM PROCEDURE TRAY) ×2 IMPLANT
TROCAR BLADELESS OPT 5 100 (ENDOMECHANICALS) ×2 IMPLANT
TROCAR UNIVERSAL OPT 12M 100M (ENDOMECHANICALS) ×2 IMPLANT
TROCAR XCEL 12X100 BLDLESS (ENDOMECHANICALS) ×2 IMPLANT
TUBING INSUF HEATED (TUBING) ×2 IMPLANT

## 2018-02-15 NOTE — Anesthesia Preprocedure Evaluation (Addendum)
Anesthesia Evaluation  Patient identified by MRN, date of birth, ID band Patient awake    Reviewed: Allergy & Precautions, NPO status , Patient's Chart, lab work & pertinent test results  Airway Mallampati: II  TM Distance: <3 FB Neck ROM: Full    Dental  (+) Teeth Intact, Dental Advisory Given   Pulmonary neg pulmonary ROS,    Pulmonary exam normal breath sounds clear to auscultation       Cardiovascular Exercise Tolerance: Good negative cardio ROS Normal cardiovascular exam Rhythm:Regular Rate:Normal     Neuro/Psych negative neurological ROS  negative psych ROS   GI/Hepatic Neg liver ROS, GERD  ,  Endo/Other  negative endocrine ROS  Renal/GU Renal InsufficiencyRenal diseaseRight renal mass     Musculoskeletal  (+) Arthritis , Osteoarthritis,    Abdominal   Peds  Hematology negative hematology ROS (+)   Anesthesia Other Findings Day of surgery medications reviewed with the patient.  Reproductive/Obstetrics                            Anesthesia Physical Anesthesia Plan  ASA: III  Anesthesia Plan: General   Post-op Pain Management:    Induction: Intravenous  PONV Risk Score and Plan: 2 and Dexamethasone, Ondansetron and Treatment may vary due to age or medical condition  Airway Management Planned: Oral ETT  Additional Equipment:   Intra-op Plan:   Post-operative Plan: Extubation in OR  Informed Consent: I have reviewed the patients History and Physical, chart, labs and discussed the procedure including the risks, benefits and alternatives for the proposed anesthesia with the patient or authorized representative who has indicated his/her understanding and acceptance.   Dental advisory given  Plan Discussed with: CRNA  Anesthesia Plan Comments: (2nd IV after induction)       Anesthesia Quick Evaluation

## 2018-02-15 NOTE — Anesthesia Postprocedure Evaluation (Signed)
Anesthesia Post Note  Patient: Adam [name]  Procedure(s) Performed: RIGHT  LAPAROSCOPIC NEPHRECTOMY (Right )     Patient location during evaluation: PACU Anesthesia Type: General Level of consciousness: awake and alert Pain management: pain level controlled Vital Signs Assessment: post-procedure vital signs reviewed and stable Respiratory status: spontaneous breathing, nonlabored ventilation, respiratory function stable and patient connected to nasal cannula oxygen Cardiovascular status: blood pressure returned to baseline and stable Postop Assessment: no apparent nausea or vomiting Anesthetic complications: no    Last Vitals:  Vitals:   02/15/18 1300 02/15/18 1336  BP: 120/65 115/67  Pulse: 65 70  Resp: 11 16  Temp: (!) 36.3 C 36.4 C  SpO2: 99% 98%    Last Pain:  Vitals:   02/15/18 1336  TempSrc: Oral  PainSc: 3                  Hildegard Hlavac

## 2018-02-15 NOTE — Transfer of Care (Signed)
Immediate Anesthesia Transfer of Care Note  Patient: Adam [name]  Procedure(s) Performed: RIGHT  LAPAROSCOPIC NEPHRECTOMY (Right )  Patient Location: PACU  Anesthesia Type:General  Level of Consciousness: drowsy and patient cooperative  Airway & Oxygen Therapy: Patient Spontanous Breathing and Patient connected to face mask oxygen  Post-op Assessment: Report given to RN and Post -op Vital signs reviewed and stable  Post vital signs: Reviewed and stable  Last Vitals:  Vitals Value Taken Time  BP 134/75 02/15/2018 11:42 AM  Temp    Pulse 71 02/15/2018 11:45 AM  Resp 14 02/15/2018 11:45 AM  SpO2 94 % 02/15/2018 11:45 AM  Vitals shown include unvalidated device data.  Last Pain:  Vitals:   02/15/18 0547  TempSrc:   PainSc: 4       Patients Stated Pain Goal: 4 (51/89/84 2103)  Complications: No apparent anesthesia complications

## 2018-02-15 NOTE — H&P (Signed)
Renal Mass  HPI: Adam [name] is a 72 year-old male established patient who is here further eval and management of a renal mass.  The mass is on the right side.   The lesion(s) was first noted on approximately 01/25/2018. The mass was seen on CT Scan.   His symptoms include blood in urine. Patient denies having flank pain, back pain, groin pain, nausea, vomiting, fever, and chills. He has seen blood in his urine. He does have a good appetite. He is not having pain in new locations. He has not recently had unwanted weight loss.   He has not had previous abdominal surgery. The patient can walk a flight of steps.   The patient denies history of diabetes, heart attack or stroke. There is not a a family history of kidney cancer. There is no family history of brain tumors (AMLs), seizures or brain aneurysm's.   The patient has no significant past medical history aside from being obese. He recently has lost 40 pounds. He has no past surgical history. He takes no medications.   The CT scan shows a 12 cm mostly anterior superior right renal mass with no renal vein involvement. The mass does take over a lot of the upper pole of the kidney. There is no hilar lymphadenopathy. There is a nice plane between the kidney and the liver, there is no evidence of other metastatic disease.       ALLERGIES: No Allergies    MEDICATIONS: Aleve     GU PSH: Cystoscopy - 01/22/2018 Locm 300-399Mg /Ml Iodine,1Ml - 01/30/2018    NON-GU PSH: Hip Replacement    GU PMH: Gross hematuria - 01/22/2018    NON-GU PMH: Arthritis GERD    FAMILY HISTORY: Heart Disease - Runs in Family   SOCIAL HISTORY: Marital Status: Married Preferred Language: English Current Smoking Status: Patient has never smoked.   Tobacco Use Assessment Completed: Used Tobacco in last 30 days? Has never drank.  Drinks 1 caffeinated drink per day. Patient's occupation is/was retired.    REVIEW OF SYSTEMS:    GU Review Male:   Patient  denies frequent urination, hard to postpone urination, burning/ pain with urination, get up at night to urinate, leakage of urine, stream starts and stops, trouble starting your stream, have to strain to urinate , erection problems, and penile pain.  Gastrointestinal (Upper):   Patient denies nausea, vomiting, and indigestion/ heartburn.  Gastrointestinal (Lower):   Patient denies diarrhea and constipation.  Constitutional:   Patient denies fever, night sweats, weight loss, and fatigue.  Skin:   Patient denies skin rash/ lesion and itching.  Eyes:   Patient denies blurred vision and double vision.  Ears/ Nose/ Throat:   Patient denies sore throat and sinus problems.  Hematologic/Lymphatic:   Patient denies swollen glands and easy bruising.  Cardiovascular:   Patient denies leg swelling and chest pains.  Respiratory:   Patient denies cough and shortness of breath.  Endocrine:   Patient denies excessive thirst.  Musculoskeletal:   Patient denies back pain and joint pain.  Neurological:   Patient denies headaches and dizziness.  Psychologic:   Patient denies depression and anxiety.   VITAL SIGNS:      02/01/2018 01:26 PM  Weight 247 lb / 112.04 kg  Height 70 in / 177.8 cm  BP 137/84 mmHg  Pulse 84 /min  Temperature 99.0 F / 37.2 C  BMI 35.4 kg/m   MULTI-SYSTEM PHYSICAL EXAMINATION:    Constitutional: Well-nourished. No physical deformities. Normally developed.  Good grooming.  Respiratory: No labored breathing, no use of accessory muscles. Normal breath sounds.  Cardiovascular: Regular rate and rhythm. No murmur, no gallop. Normal temperature, normal extremity pulses, no swelling, no varicosities.  Gastrointestinal: No mass, no tenderness, no rigidity, non obese abdomen.   Ears, Nose, Mouth, and Throat: Left ear no scars, no lesions, no masses. Right ear no scars, no lesions, no masses. Nose no scars, no lesions, no masses. Normal hearing. Normal lips.  Musculoskeletal: Normal gait and  station of head and neck.     PAST DATA REVIEWED:  Source Of History:  Patient  Records Review:   Previous Doctor Records, Previous Patient Records  X-Ray Review: C.T. Abdomen/Pelvis: Reviewed Films. Discussed With Patient.     PROCEDURES:         C.T. Chest w/o Contrast - 71250         ASSESSMENT:      ICD-10 Details  1 GU:   Benign Neo Kidney, Unspec - D30.00    PLAN:           Orders X-Rays: C.T. Chest Without Contrast          Schedule         Document Letter(s):  Created for Patient: Clinical Summary    The patient and I talked at length about treatment options of kidney cancer. Etiologies of kidney cancer were discussed with the patient.   Treatment options were discussed with the patient including radical nephrectomy, partial nephrectomy, radical nephroureterectomy, and ablative procedures. The possible need for postoperative chemotherapy, radiation therapy, and immunotherapy were discussed. The pathological grades of kidney cancer, stages of kidney cancer including possible local nodal spread, local extension, renal vein involvement, and IVC involvement were discussed as well. The risks, benefits, and some of the possible complications of all of the above treatment options were discussed with the patient as well.   We discussed the possible future recurrence of kidney cancer. Alternative treatment options were discussed with the patient in detail. All questions were answered.   The patient gave informed consent to proceed with a laparoscopic radical nephrectomy for treatment of the clinically localized kidney cancer. They understands that the surgery may need to be done through and open incision if the laparoscopic approach is not possible.        Notes:   The patient has a large 12 cm right renal mass, with the appearance concerning for renal cell carcinoma. Fortunately, there does not appear to be any renal vein involvement or evidence of metastatic disease. He  still needs a CT scan of his chest to complete his evaluation. The contralateral kidney appears to be well-perfused and adequate to sustain him after he undergoes a right radical nephrectomy.

## 2018-02-15 NOTE — Discharge Instructions (Signed)

## 2018-02-15 NOTE — Op Note (Signed)
Preoperative diagnosis:  1. Right renal mass   Postoperative diagnosis:  1. same   Procedure: 1. Laparoscopic right radical nephrectomy  Surgeon: Ardis Hughs, MD  First Assistant: Azucena Fallen, NP  Anesthesia: General  Complications: None  Intraoperative findings: Large upper pole renal mass  EBL: 100cc  Specimens:  Right kidney and proximal ureter  Indication: Adam [name] is a 72 y.o. patient with hematuria whose work-up demonstrated a large right renal mass.  After reviewing the management options for treatment, he elected to proceed with the above surgical procedure(s). We have discussed the potential benefits and risks of the procedure, side effects of the proposed treatment, the likelihood of the patient achieving the goals of the procedure, and any potential problems that might occur during the procedure or recuperation. Informed consent has been obtained.  Description of procedure:  An assistant was required for this surgical procedure.  The duties of the assistant included but were not limited to suctioning, passing suture, camera manipulation, retraction. This procedure would not be able to be performed without an Environmental consultant.  A site was selected lateral to the umbilicus for placement of the camera port. This was placed using a standard open Hassan technique which allowed entry into the peritoneal cavity under direct vision and without difficulty. A 12 mm Hassan cannula was placed and a pneumoperitoneum established. The camera was then used to inspect the abdomen and there was no evidence of any intra-abdominal injuries or other abnormalities. The remaining abdominal ports were then placed under visual guidance.  A second 12 mm port was placed in the right lower quadrant approximately 8 cm away from the camera. A 5 mm port was placed in the right upper quadrant again 8 cm away from the camera. A second 5 mm port was placed just inferior to the xiphoid process in the  midline, this was used as a liver retractor.  An additional 5 mm port was then placed in the right lower quadrant at the anterior axillary line lateral to the 12 mm port. All ports were placed under direct vision without difficulty.   The white line of Toldt was incised allowing the colon to be mobilized medially and the plane between the mesocolon and the anterior layer of Gerota's fascia to be developed and the kidney exposed. The ureter and gonadal vein were identified inferiorly and the ureter was lifted anteriorly off the psoas muscle. Dissection proceeded superiorly along the gonadal vein until the renal vein was identified. The renal hilum was then carefully isolated with a combination of blunt and sharp dissectiong allowing the renal arterial and venous structures to be separated and isolated.   The renal artery and renal vein was then isolated and also ligated and divided with a 45 mm Flex ETS stapler.   The dissection continued superiorly around the adrenal gland which was kept as part of the specimen.  The hepatorenal ligaments were divided. The lateral and posterior attachements to the kidney were then divided. Once the kidney was free from its attachments  40cc of 0.25% rupivocaine was then injected into the right anterior axillary line b/w the iliac crest and the twelfth rib under laparoscopic guidance. The layer between the tranversus abdominus and the internal oblique was targeted.   The kidney/ureter specimen was then placed into a 39mm Endocatch II retrieval bag. The renal hilum, liver, adrenal bed and gonadal vein areas were each inspected and hemostasis was ensured with the pneomperitoneal pressures lowered. Surgicel was then placed over the hilum.  The camera was then brought to the 12 mm port laterally and the camera port was removed and the fascia closed using a [place] needle with a 0 Vicryl.  All the ports were then removed under visual guidance. The lateral 12 mm port and 5  mm port were then connected sharply with a 15 blade. Then opened this incision down to the external oblique fascia. We then spread the muscle fibers down to the internal oblique fascia which we then opened with cautery. These were then spread in all muscle spared and the posterior peritoneum was opened. The rectus muscle was pulled medially. The specimen was then removed through these incision. The internal oblique fascia was then closed with a 0 Vicryl in a running fashion. The external oblique fascia was then closed with a 0 PDS in a running fashion.  All incisions were injected with local anesthetic and reapproximated at the skin with Skin staples. The incisions were then dressed.  The patient tolerated the procedure well and without complications and was transferred to the recovery unit in satisfactory condition.   Ardis Hughs, M.D.

## 2018-02-15 NOTE — Anesthesia Procedure Notes (Signed)
Procedure Name: Intubation Performed by: Taran Haynesworth J, CRNA Pre-anesthesia Checklist: Patient identified, Emergency Drugs available, Suction available, Patient being monitored and Timeout performed Patient Re-evaluated:Patient Re-evaluated prior to induction Oxygen Delivery Method: Circle system utilized Preoxygenation: Pre-oxygenation with 100% oxygen Induction Type: IV induction Ventilation: Mask ventilation without difficulty Laryngoscope Size: Mac and 4 Grade View: Grade I Tube type: Oral Tube size: 7.5 mm Number of attempts: 1 Airway Equipment and Method: Stylet Placement Confirmation: ETT inserted through vocal cords under direct vision,  positive ETCO2,  CO2 detector and breath sounds checked- equal and bilateral Secured at: 23 cm Tube secured with: Tape Dental Injury: Teeth and Oropharynx as per pre-operative assessment        

## 2018-02-16 LAB — BASIC METABOLIC PANEL
Anion gap: 8 (ref 5–15)
BUN: 20 mg/dL (ref 6–20)
CHLORIDE: 108 mmol/L (ref 101–111)
CO2: 23 mmol/L (ref 22–32)
Calcium: 8 mg/dL — ABNORMAL LOW (ref 8.9–10.3)
Creatinine, Ser: 1.6 mg/dL — ABNORMAL HIGH (ref 0.61–1.24)
GFR calc Af Amer: 48 mL/min — ABNORMAL LOW (ref >60)
GFR calc non Af Amer: 42 mL/min — ABNORMAL LOW (ref >60)
Glucose, Bld: 131 mg/dL — ABNORMAL HIGH (ref 65–99)
POTASSIUM: 4.5 mmol/L (ref 3.5–5.1)
Sodium: 139 mmol/L (ref 135–145)

## 2018-02-16 LAB — CBC
HEMATOCRIT: 43.4 % (ref 39.0–52.0)
Hemoglobin: 14.7 g/dL (ref 13.0–17.0)
MCH: 32.4 pg (ref 26.0–34.0)
MCHC: 33.9 g/dL (ref 30.0–36.0)
MCV: 95.6 fL (ref 78.0–100.0)
Platelets: 169 10*3/uL (ref 150–400)
RBC: 4.54 MIL/uL (ref 4.22–5.81)
RDW: 12.8 % (ref 11.5–15.5)
WBC: 12 10*3/uL — ABNORMAL HIGH (ref 4.0–10.5)

## 2018-02-16 MED ORDER — BISACODYL 10 MG RE SUPP
10.0000 mg | Freq: Once | RECTAL | Status: AC
Start: 2018-02-16 — End: 2018-02-16
  Administered 2018-02-16: 10 mg via RECTAL
  Filled 2018-02-16: qty 1

## 2018-02-16 NOTE — Progress Notes (Signed)
Patient up to bathroom, voided pale yellow urine without difficulty.  Ambulatory in hallway with walker.

## 2018-02-16 NOTE — Progress Notes (Signed)
Foley Catheter was removed. Patient tolerated this well

## 2018-02-16 NOTE — Progress Notes (Signed)
Patient ID: Adam [name], male   DOB: [birth year], 72 y.o.   MRN: [id number]  1 Day Post-Op Subjective: Pt doing well.  Pain controlled. Ambulating well.  No N/V.  No flatus or BM.  Objective: Vital signs in last 24 hours: Temp:  [96.8 F (36 C)-98.9 F (37.2 C)] 98.2 F (36.8 C) (04/27 0533) Pulse Rate:  [61-84] 61 (04/27 0533) Resp:  [11-18] 18 (04/27 0533) BP: (115-137)/(64-83) 127/83 (04/27 0533) SpO2:  [92 %-99 %] 97 % (04/27 0533)  Intake/Output from previous day: 04/26 0701 - 04/27 0700 In: 4293.3 [I.V.:4093.3; IV Piggyback:200] Out: 4950 [Urine:4900; Blood:50] Intake/Output this shift: No intake/output data recorded.  Physical Exam:  General: Alert and oriented CV: RRR Lungs: Clear Abdomen: Soft, ND Incisions: Ext: NT, No erythema  Lab Results: Recent Labs    02/14/18 1508 02/15/18 1226 02/16/18 0406  HGB 16.8 15.1 14.7  HCT 47.0 43.8 43.4   BMET Recent Labs    02/15/18 1226 02/16/18 0406  NA 139 139  K 4.2 4.5  CL 105 108  CO2 24 23  GLUCOSE 183* 131*  BUN 20 20  CREATININE 1.54* 1.60*  CALCIUM 8.2* 8.0*     Studies/Results: No results found.  Assessment/Plan: POD # 1 s/p lap right radical nephrectomy - Path pending - SL IVF - D/C catheter - Advance diet - Oral pain medication - Follow renal function - Ambulate - D/C probably in AM tomorrow   LOS: 1 day   Laurna Shetley,LES 02/16/2018, 9:05 AM

## 2018-02-16 NOTE — Plan of Care (Signed)
Patient stable during 7 a to 7 p shift, tolerating regular diet and ambulating in hallways.  Patient voiding without difficulty, has not had a BM this shift but is passing gas.  Multiple family members at bedside during shift.  Medicated for pain x 1 with improvement.

## 2018-02-17 DIAGNOSIS — C641 Malignant neoplasm of right kidney, except renal pelvis: Secondary | ICD-10-CM | POA: Diagnosis present

## 2018-02-17 DIAGNOSIS — D49519 Neoplasm of unspecified behavior of unspecified kidney: Secondary | ICD-10-CM | POA: Diagnosis present

## 2018-02-17 LAB — BASIC METABOLIC PANEL
ANION GAP: 8 (ref 5–15)
BUN: 23 mg/dL — AB (ref 6–20)
CO2: 22 mmol/L (ref 22–32)
Calcium: 8.1 mg/dL — ABNORMAL LOW (ref 8.9–10.3)
Chloride: 107 mmol/L (ref 101–111)
Creatinine, Ser: 1.71 mg/dL — ABNORMAL HIGH (ref 0.61–1.24)
GFR calc Af Amer: 45 mL/min — ABNORMAL LOW (ref >60)
GFR calc non Af Amer: 38 mL/min — ABNORMAL LOW (ref >60)
GLUCOSE: 98 mg/dL (ref 65–99)
Potassium: 4.1 mmol/L (ref 3.5–5.1)
Sodium: 137 mmol/L (ref 135–145)

## 2018-02-17 LAB — HEMOGLOBIN AND HEMATOCRIT, BLOOD
HCT: 43.8 % (ref 39.0–52.0)
Hemoglobin: 14.5 g/dL (ref 13.0–17.0)

## 2018-02-17 MED ORDER — BISACODYL 10 MG RE SUPP
10.0000 mg | Freq: Once | RECTAL | Status: AC
  Administered 2018-02-17: 10 mg via RECTAL
  Filled 2018-02-17: qty 1

## 2018-02-17 NOTE — Discharge Summary (Signed)
  Date of admission: 02/15/2018  Date of discharge: 02/17/2018  Admission diagnosis: Left renal neoplasm  Discharge diagnosis: Left renal neoplasm  History and Physical: For full details, please see admission history and physical. Briefly, Adam [name] is a 72 y.o. year old patient with a large left renal neoplasm.   Hospital Course: He presented to the hospital and underwent a right laparoscopic radical nephrectomy on 02/15/18.  He was transferred to a hospital room and remained hemodynamically stable.  He was able to begin ambulating and his diet was advanced.  He had signs of bowel function returning and was able to be discharged home on 02/17/18.  Laboratory values:  Recent Labs    02/15/18 1226 02/16/18 0406 02/17/18 0504  HGB 15.1 14.7 14.5  HCT 43.8 43.4 43.8   Recent Labs    02/16/18 0406 02/17/18 0504  CREATININE 1.60* 1.71*    Disposition: Home  Discharge instruction: The patient was instructed to be ambulatory but told to refrain from heavy lifting, strenuous activity, or driving.   Discharge medications:  Allergies as of 02/17/2018   No Known Allergies     Medication List    STOP taking these medications   naproxen sodium 220 MG tablet Commonly known as:  ALEVE     TAKE these medications   docusate sodium 100 MG capsule Commonly known as:  COLACE Take 1 capsule (100 mg total) by mouth 2 (two) times daily.   traMADol 50 MG tablet Commonly known as:  ULTRAM Take 1-2 tablets (50-100 mg total) by mouth every 6 (six) hours as needed for moderate pain.       Followup:  Follow-up Information    Ardis Hughs, MD Follow up on 02/27/2018.   Specialty:  Urology Why:  3:30pm Contact information: [place] [address] [phone]

## 2018-02-17 NOTE — Progress Notes (Signed)
Patient ID: Adam [name], male   DOB: 1946-03-26, 72 y.o.   MRN: [id number]  2 Days Post-Op Subjective: Pt has had some bloating.  Passed some flatus.  No BM yet.  Ambulating well.  Pain controlled.  Objective: Vital signs in last 24 hours: Temp:  [97.8 F (36.6 C)-98.7 F (37.1 C)] 98.7 F (37.1 C) (04/28 0508) Pulse Rate:  [62-76] 76 (04/28 0508) Resp:  [16-20] 16 (04/28 0508) BP: (126-147)/(64-88) 147/88 (04/28 0508) SpO2:  [95 %-100 %] 95 % (04/28 0508)  Intake/Output from previous day: 04/27 0701 - 04/28 0700 In: 582 [P.O.:582] Out: 500 [Urine:500] Intake/Output this shift: No intake/output data recorded.  Physical Exam:  General: Alert and oriented CV: RRR Lungs: Clear Abdomen: Soft, ND, Positive BS. Incisions: Ecchymosis over right lower quadrant incision.  Other incisions look good. Ext: NT, No erythema  Lab Results: Recent Labs    02/15/18 1226 02/16/18 0406 02/17/18 0504  HGB 15.1 14.7 14.5  HCT 43.8 43.4 43.8   BMET Recent Labs    02/16/18 0406 02/17/18 0504  NA 139 137  K 4.5 4.1  CL 108 107  CO2 23 22  GLUCOSE 131* 98  BUN 20 23*  CREATININE 1.60* 1.71*  CALCIUM 8.0* 8.1*     Studies/Results: No results found.  Assessment/Plan: POD # 2 s/p lap right nephrectomy - Path pending - Dulcolax suppository this morning with plans to D/C home later this morning/early afternoon   LOS: 2 days   Jenson Beedle,LES 02/17/2018, 9:41 AM

## 2018-02-17 NOTE — Plan of Care (Signed)
Patient was given discharge instructions and verbalized understanding. Patient transfer to main entrance with wife.

## 2020-03-26 ENCOUNTER — Other Ambulatory Visit: Payer: Self-pay

## 2020-03-26 ENCOUNTER — Ambulatory Visit (HOSPITAL_COMMUNITY)
Admission: RE | Admit: 2020-03-26 | Discharge: 2020-03-26 | Disposition: A | Discharge status: Home / Self Care | Payer: Medicare PPO | Source: Ambulatory Visit | Attending: Urology | Admitting: Urology

## 2020-03-26 ENCOUNTER — Other Ambulatory Visit (HOSPITAL_COMMUNITY): Payer: Self-pay | Admitting: Urology

## 2020-03-26 DIAGNOSIS — C642 Malignant neoplasm of left kidney, except renal pelvis: Secondary | ICD-10-CM

## 2020-09-20 ENCOUNTER — Ambulatory Visit (HOSPITAL_COMMUNITY)
Admission: RE | Admit: 2020-09-20 | Discharge: 2020-09-20 | Disposition: A | Discharge status: Home / Self Care | Payer: Medicare PPO | Source: Ambulatory Visit | Attending: Urology | Admitting: Urology

## 2020-09-20 ENCOUNTER — Other Ambulatory Visit: Payer: Self-pay

## 2020-09-20 ENCOUNTER — Encounter (HOSPITAL_COMMUNITY): Payer: Self-pay

## 2020-09-20 ENCOUNTER — Other Ambulatory Visit (HOSPITAL_COMMUNITY): Payer: Self-pay | Admitting: Urology

## 2020-09-20 DIAGNOSIS — C642 Malignant neoplasm of left kidney, except renal pelvis: Secondary | ICD-10-CM | POA: Insufficient documentation

## 2021-06-11 IMAGING — DX DG CHEST 2V
2 series · 2 of 2 positions shown · non-contrast
Comparison: Chest radiograph dated 03/26/2020.

CLINICAL DATA: 73-year-old male with renal cell carcinoma.

EXAM:
CHEST - 2 VIEW

[chest pa]
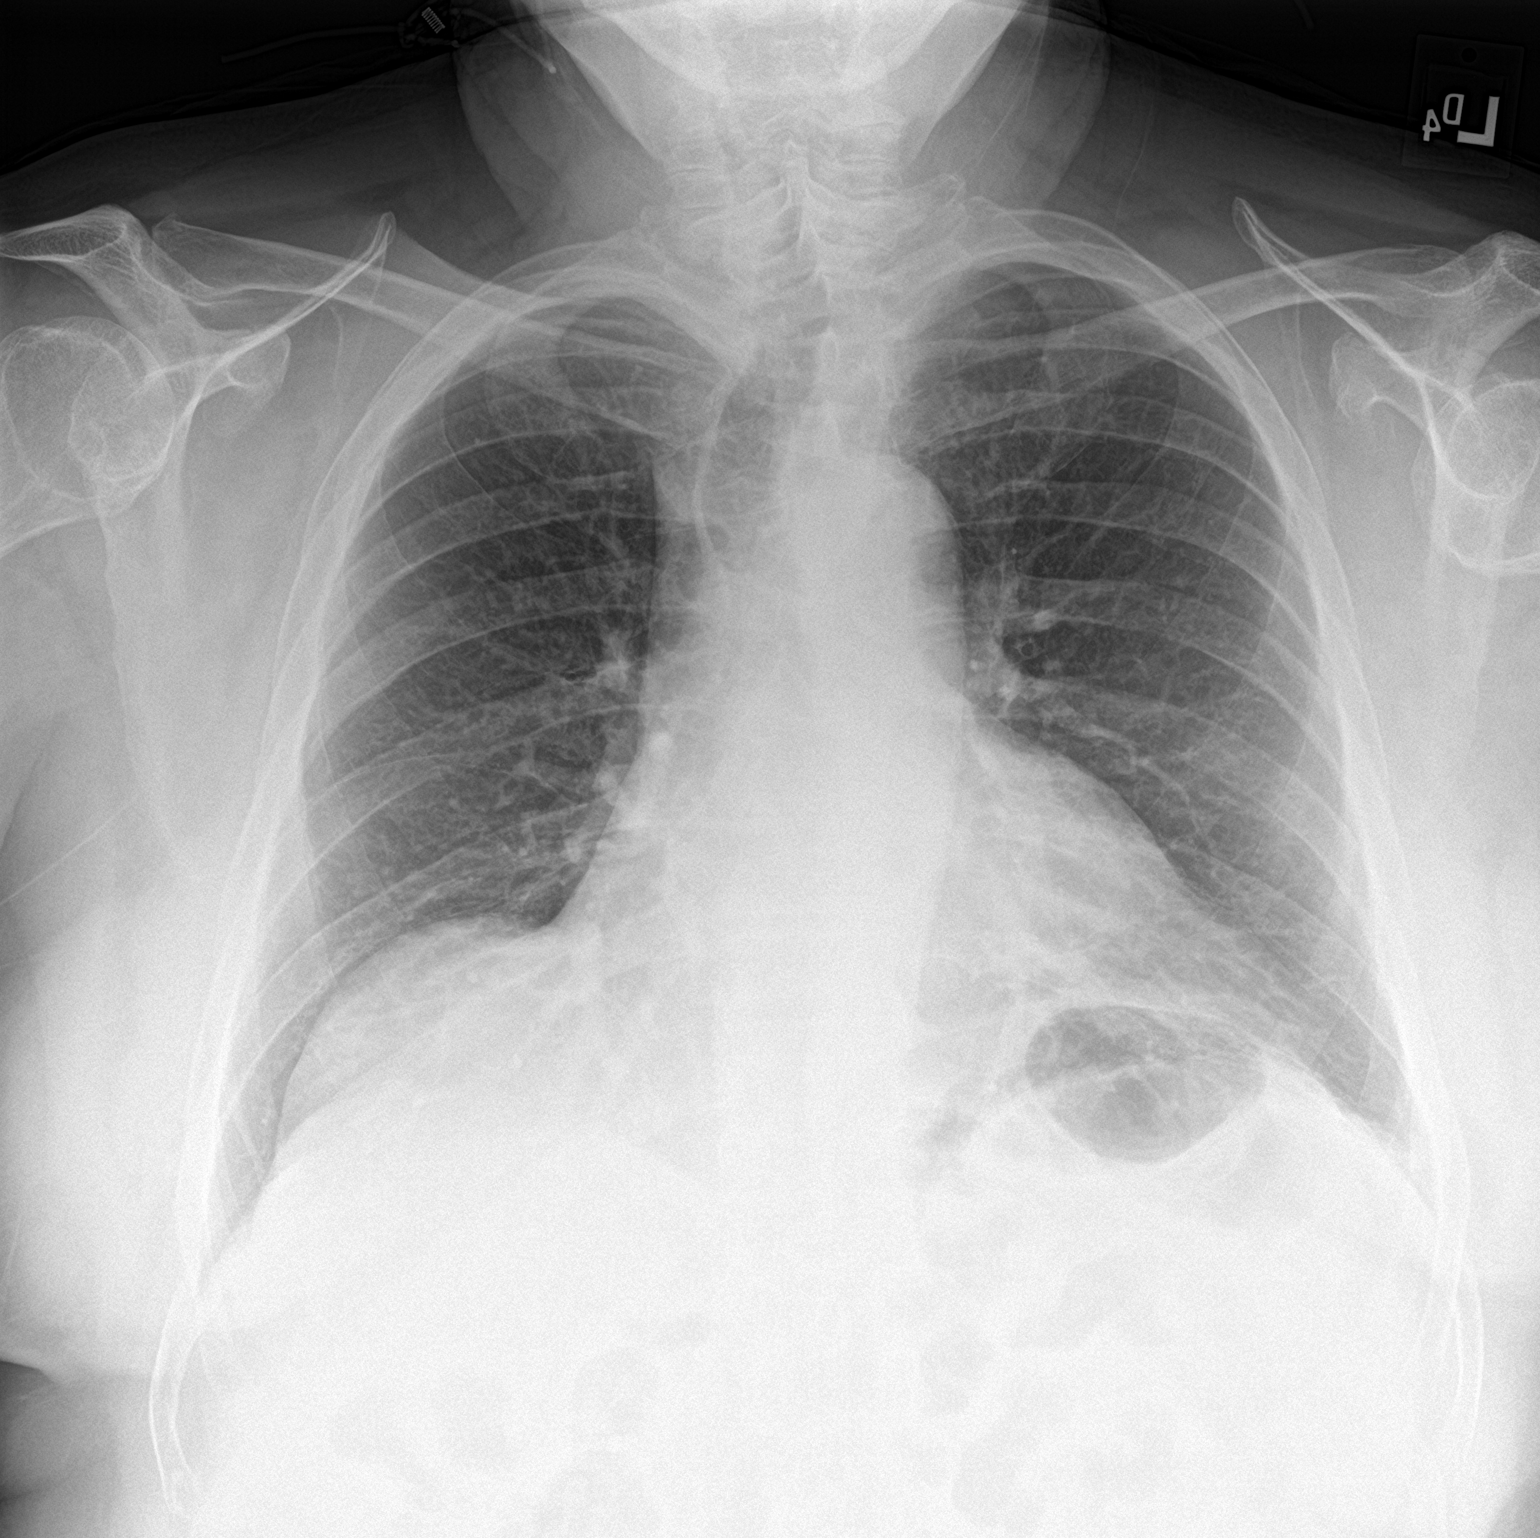

[chest lat]
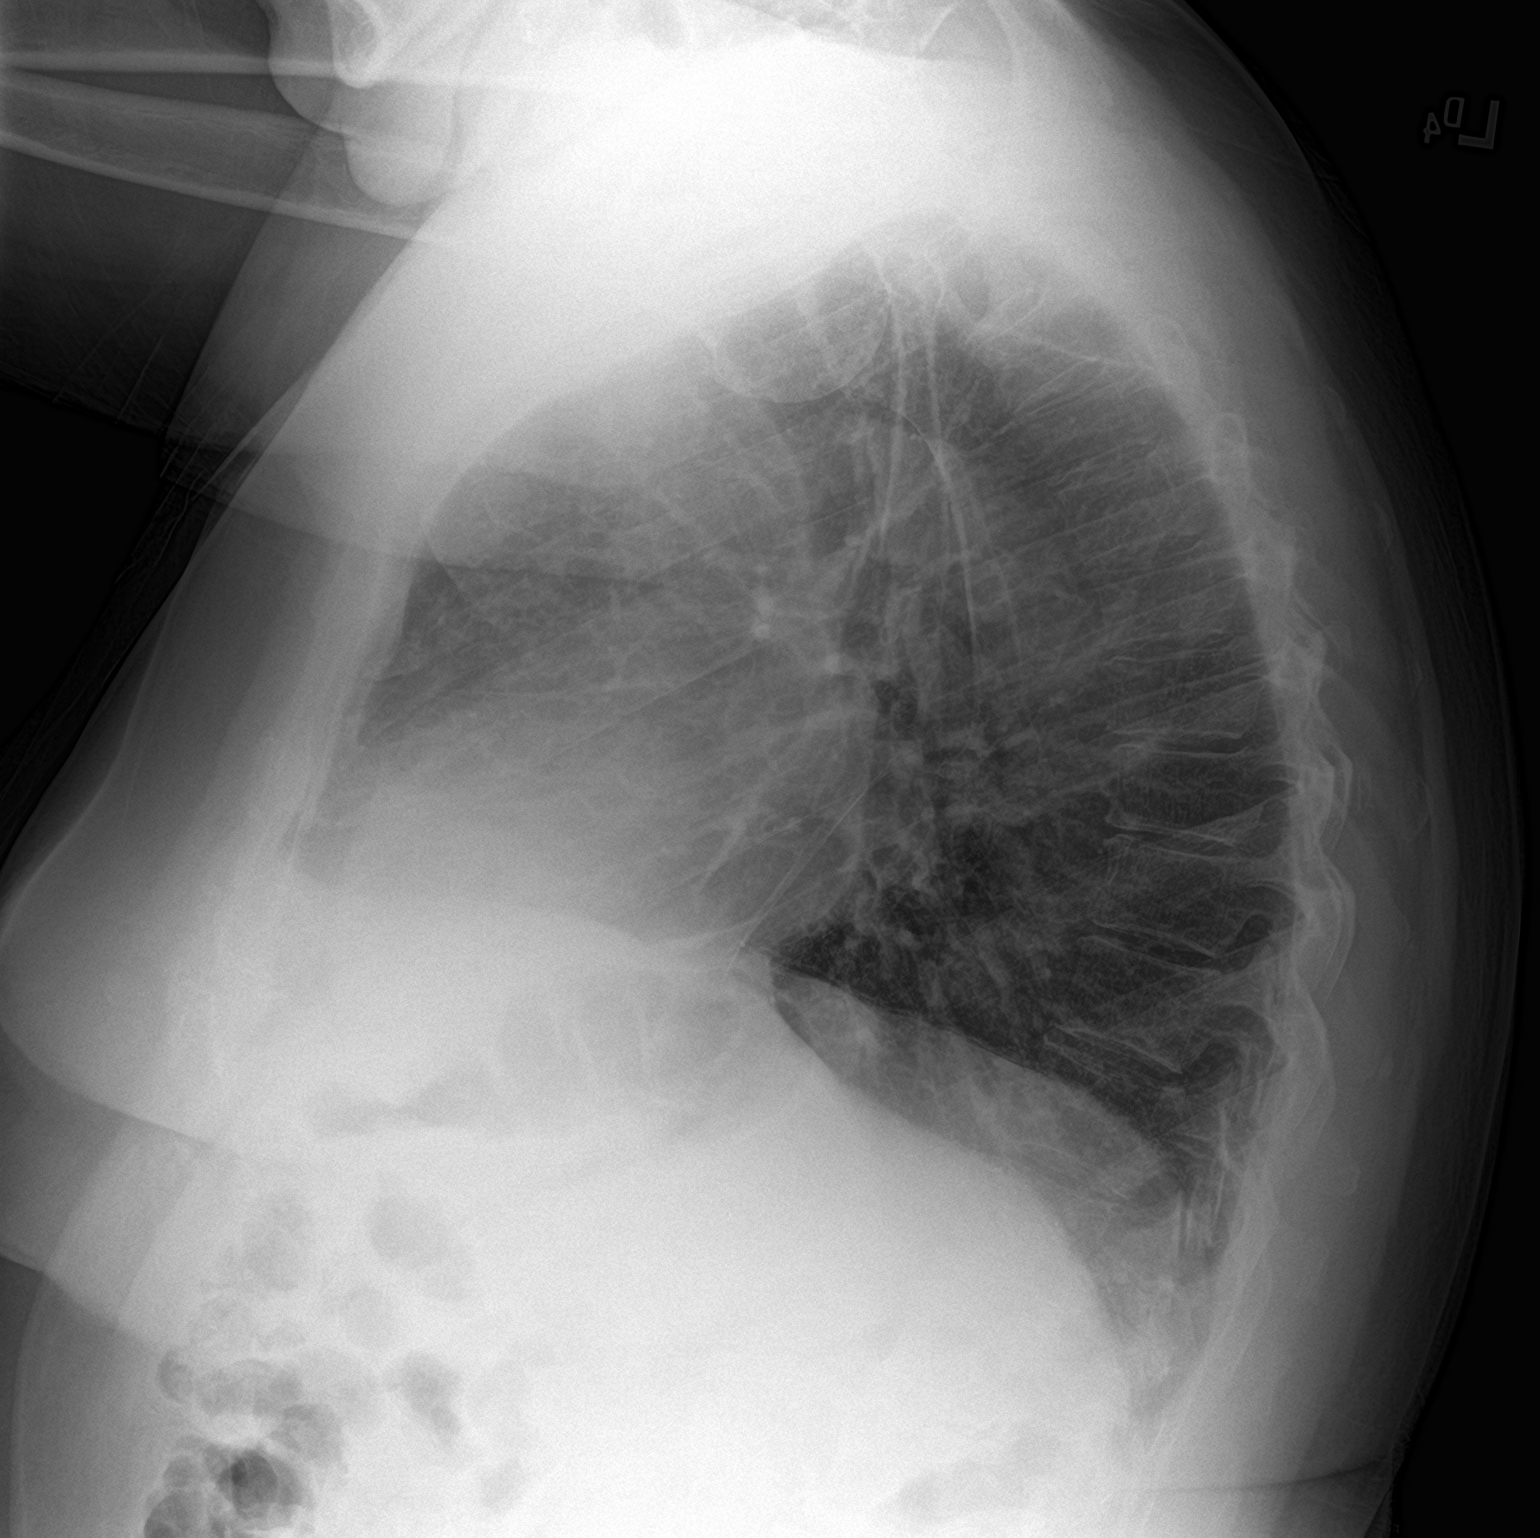

[2 of 2 positions shown; findings below may reference images not displayed]

FINDINGS: Minimal left lung base atelectasis. No focal consolidation, pleural
effusion, pneumothorax. The cardiac silhouette is within limits. No
acute osseous pathology.
IMPRESSION: No active cardiopulmonary disease.

## 2023-10-05 ENCOUNTER — Other Ambulatory Visit (HOSPITAL_COMMUNITY): Payer: Self-pay | Admitting: Urology

## 2023-10-05 ENCOUNTER — Ambulatory Visit (HOSPITAL_COMMUNITY)
Admission: RE | Admit: 2023-10-05 | Discharge: 2023-10-05 | Disposition: A | Discharge status: Home / Self Care | Payer: TRICARE For Life (TFL) | Source: Ambulatory Visit | Attending: Urology

## 2023-10-05 DIAGNOSIS — C642 Malignant neoplasm of left kidney, except renal pelvis: Secondary | ICD-10-CM | POA: Insufficient documentation

## 2023-10-15 DIAGNOSIS — Z905 Acquired absence of kidney: Secondary | ICD-10-CM | POA: Diagnosis not present

## 2023-10-15 DIAGNOSIS — C642 Malignant neoplasm of left kidney, except renal pelvis: Secondary | ICD-10-CM | POA: Diagnosis not present

## 2023-10-15 DIAGNOSIS — I700 Atherosclerosis of aorta: Secondary | ICD-10-CM | POA: Diagnosis not present

## 2023-11-05 DIAGNOSIS — R911 Solitary pulmonary nodule: Secondary | ICD-10-CM | POA: Diagnosis not present

## 2023-11-05 DIAGNOSIS — C641 Malignant neoplasm of right kidney, except renal pelvis: Secondary | ICD-10-CM | POA: Diagnosis not present

## 2024-05-05 DIAGNOSIS — R911 Solitary pulmonary nodule: Secondary | ICD-10-CM | POA: Diagnosis not present

## 2024-05-13 DIAGNOSIS — R911 Solitary pulmonary nodule: Secondary | ICD-10-CM | POA: Diagnosis not present

## 2024-05-13 DIAGNOSIS — R918 Other nonspecific abnormal finding of lung field: Secondary | ICD-10-CM | POA: Diagnosis not present

## 2024-06-03 DIAGNOSIS — C641 Malignant neoplasm of right kidney, except renal pelvis: Secondary | ICD-10-CM | POA: Diagnosis not present

## 2024-06-03 DIAGNOSIS — R911 Solitary pulmonary nodule: Secondary | ICD-10-CM | POA: Diagnosis not present

## 2024-07-04 ENCOUNTER — Inpatient Hospital Stay
Admission: RE | Admit: 2024-07-04 | Discharge: 2024-07-04 | Disposition: A | Discharge status: Home / Self Care | Payer: Self-pay | Source: Ambulatory Visit

## 2024-07-04 ENCOUNTER — Other Ambulatory Visit: Payer: Self-pay | Admitting: Medical Oncology

## 2024-07-04 DIAGNOSIS — R918 Other nonspecific abnormal finding of lung field: Secondary | ICD-10-CM

## 2024-07-09 ENCOUNTER — Telehealth: Payer: Self-pay

## 2024-07-09 NOTE — Telephone Encounter (Signed)
 Called to touch base with the patient to get him set up for a new patient appointment with Dr.Chang. Left VM asking that the patient call us  back to set up his appointment.

## 2024-07-25 NOTE — Progress Notes (Signed)
  Cancer Center CONSULT NOTE  Patient Care Team: Nanci Senior, MD as PCP - General (Family Medicine)  ASSESSMENT & PLAN:  Adam [name] is a 78 y.o.male with history of rheumatoid arthritis on methotrexate, right sided renal cell carcinoma being [facility] RCC.  Initial diagnosis: 2019 pT3a G3 18.8 cm right side ccRCC margins negative CT from [facility] records showed multiple small pulmonary nodules increasing slowly.  Largest nodule reported 0.9 cm from CT in July 2025.  Several other bilateral nodules reported.  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  The sizes are very small and they do not pose a threat or symptoms at this time.  Recommend continue to monitor with CT scan.  If size increase over time, may consider referral for navigational bronchoscopy and biopsy for diagnosis.  We discussed renal cell carcinoma is treatable even in stage IV setting.  Modern treatment including immunotherapy combination, or combination with targeted therapy.  He has underlying rheumatoid arthritis.  Immunotherapy may increase risks of rheumatoid arthritis flare, and immune related reactions.  Discussed immunotherapy may be considered in the future.  Reasonable to postpone treatment at this time given risks likely outweigh the benefit at current time point.  IMDC risk: Not enough information to assess at this time.  [facility].  Assessment & Plan Clear cell carcinoma of right kidney [facility]) Continue surveillance imaging with CT chest, abdomen and pelvis as planned in January Follow-up with me in February Please fax CBC, CMP and LDH to us  if available at the [facility] Report any new symptoms.  All questions were answered. The patient knows to call the clinic with any problems, questions or concerns. No barriers to learning was detected.  Pauletta JAYSON Chihuahua,  MD 10/6/202510:14 AM  CHIEF COMPLAINTS/PURPOSE OF CONSULTATION:  Right RCC  HISTORY OF PRESENTING ILLNESS:  UKIAH [name] 78 y.o. male is here because of right kidney cancer.  I have reviewed his chart and materials related to his cancer extensively and collaborated history with the patient. Summary of oncologic history is as follows:  Report patient had right radical nephrectomy in 2019 and pathology showed renal cell carcinoma.  T3a 18.8 cm with invasion into perinephritic fat and negative surgical margin.  He reports first noted hematuria. He was referred to Dr. Cam and he underwent right nephrectomy very quickly.  02/15/2018 radical right nephrectomy Renal cell carcinoma, clear-cell type G3. 18.8 cm invades renal sinus fat pT3a. Ureteral, vascular and surgical margins are negative for tumor. Negative for sarcomatoid and rhabdoid features  CT scan on 10/29/2023 showed bilateral pulmonary nodule the largest measuring 6 mm in the right minor fissure.  Reports slightly larger from 1 year ago.  10/15/23 CT chest abdomen and pelvis Right minor fissure nodule 6 mm previous 5 mm, increased size from 2022 of 2 mm Left upper lobe nodule 3 mm unchanged Left lower lobe nodule 4 mm from 3 mm Right lower lobe nodule 4 mm previously 2 to 3 mm   05/05/24 creatinine 1.4 BUN 21 05/13/24 CT chest with contrast Continue enlargement of inferior right upper lobe nodule at 0.9 cm, previously 0.6 cm Continue enlargement of small left lower lobe nodule 0.7 cm, previously 0.4 cm Continue enlargement of small left upper lobe nodule 0.6 cm, previously 0.3 cm Additional small nodules not significantly changed for example 0.4 cm superior right lower lobe nodule  Currently,  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  He denies any new physical changes.  No new headaches, double vision, focal weakness, chest pain, shortness of breath, coughing, abdominal pain, stool changes, bloody stool,  difficulty urinating, hematuria, night sweats, unexpected weight loss, palpable mass or lumps.  Rheumatoid arthritis affecting his hand joints and left ankle.  He does not take any medication specifically for this.  He use Tylenol  as needed.  MEDICAL HISTORY:  Past Medical History:  Diagnosis Date   Arthritis    GERD (gastroesophageal reflux disease)    if i eat after 7pm    Right renal mass     SURGICAL HISTORY: Past Surgical History:  Procedure Laterality Date   EYE SURGERY  1992   bilateral cataract extraction at [facility] eye clinic    JOINT REPLACEMENT     RIGHT THA 04-14-2002 DR.DANIEL MURPHY; LEFT THA 04-21-2009 DR. DANIEL MURPHY    LAPAROSCOPIC NEPHRECTOMY, HAND ASSISTED Right 02/15/2018   Procedure: RIGHT  LAPAROSCOPIC NEPHRECTOMY;  Surgeon: Cam Morene ORN, MD;  Location: [facility];  Service: Urology;  Laterality: Right;   MASS EXCISION Left 07/11/2000   ELBOW ; DR. DONNICE ROBINSONS    OLECRANON BURSECTOMY Right 05/30/2000   DR DONNICE ROBINSONS    SOCIAL HISTORY: Social History   Socioeconomic History   Marital status: Married    Spouse name: Not on file   Number of children: Not on file   Years of education: Not on file   Highest education level: Not on file  Occupational History   Not on file  Tobacco Use   Smoking status: Never   Smokeless tobacco: Never  Substance and Sexual Activity   Alcohol use: Never   Drug use: Never   Sexual activity: Not on file  Other Topics Concern   Not on file  Social History Narrative   Not on file   Social Drivers of Health   Financial Resource Strain: Not on file  Food Insecurity: No Food Insecurity (07/28/2024)   Hunger Vital Sign    Worried About Running Out of Food in the Last Year: Never true    Ran Out of Food in the Last Year: Never true  Transportation Needs: No Transportation Needs (07/28/2024)   PRAPARE - Administrator, Civil Service (Medical): No    Lack of Transportation (Non-Medical): No   Physical Activity: Not on file  Stress: Not on file  Social Connections: Unknown (03/06/2022)   [facility]   Social Network    Social Network: Not on file  Intimate Partner Violence: Not At Risk (07/28/2024)   Humiliation, Afraid, Rape, and Kick questionnaire    Fear of Current or Ex-Partner: No    Emotionally Abused: No    Physically Abused: No    Sexually Abused: No    FAMILY HISTORY: No family history on file.  ALLERGIES:  has no known allergies.  MEDICATIONS:  Current Outpatient Medications  Medication Sig Dispense Refill   docusate sodium  (COLACE) 100 MG capsule Take 1 capsule (100 mg total) by mouth 2 (two) times daily. 60 capsule 0   traMADol  (ULTRAM ) 50 MG tablet Take 1-2 tablets (50-100 mg total) by mouth every 6 (six) hours as needed for moderate pain. 30 tablet 0   No current facility-administered medications for this visit.    REVIEW OF SYSTEMS:   All relevant systems were reviewed with the patient and are negative.  PHYSICAL EXAMINATION: ECOG PERFORMANCE STATUS: 1 - Symptomatic  but completely ambulatory  Vitals:   07/28/24 0914  BP: 124/76  Pulse: 83  Resp: 20  Temp: (!) 97 F (36.1 C)  SpO2: 98%   Filed Weights   07/28/24 0914  Weight: 257 lb 9.6 oz (116.8 kg)    GENERAL: alert, no distress and comfortable SKIN: skin color is normal, no jaundice EYES: sclera clear OROPHARYNX: no exudate, no erythema NECK: supple LYMPH:  no palpable lymphadenopathy in the cervical, axillary regions LUNGS: Effort normal, no respiratory distress.  Clear to auscultation bilaterally HEART: regular rate & rhythm and no lower extremity edema ABDOMEN: soft, non-tender and nondistended Musculoskeletal: MCP deformity over bilateral hands NEURO: no focal motor/sensory deficits.  Strength and sensation equal bilaterally.  LABORATORY DATA:  I have reviewed the data as listed Lab Results  Component Value Date   WBC 12.0 (H) 02/16/2018   HGB 14.5  02/17/2018   HCT 43.8 02/17/2018   MCV 95.6 02/16/2018   PLT 169 02/16/2018   No results for input(s): NA, K, CL, CO2, GLUCOSE, BUN, CREATININE, CALCIUM, GFRNONAA, GFRAA, PROT, ALBUMIN, AST, ALT, ALKPHOS, BILITOT, BILIDIR, IBILI in the last 8760 hours.  RADIOGRAPHIC STUDIES: I have personally reviewed outside imaging report.

## 2024-07-28 ENCOUNTER — Inpatient Hospital Stay

## 2024-07-28 VITALS — BP 124/76 | HR 83 | Temp 97.0°F | Resp 20 | Wt 257.6 lb

## 2024-07-28 DIAGNOSIS — Z905 Acquired absence of kidney: Secondary | ICD-10-CM | POA: Diagnosis not present

## 2024-07-28 DIAGNOSIS — C641 Malignant neoplasm of right kidney, except renal pelvis: Secondary | ICD-10-CM | POA: Diagnosis not present

## 2024-07-28 DIAGNOSIS — M069 Rheumatoid arthritis, unspecified: Secondary | ICD-10-CM | POA: Insufficient documentation

## 2024-07-28 DIAGNOSIS — R918 Other nonspecific abnormal finding of lung field: Secondary | ICD-10-CM

## 2024-07-28 NOTE — Assessment & Plan Note (Addendum)
 Continue surveillance imaging with CT chest, abdomen and pelvis as planned in January Follow-up with me in February Please fax CBC, CMP and LDH to us  if available at the [facility] Report any new symptoms.

## 2024-08-04 ENCOUNTER — Encounter: Payer: Self-pay | Admitting: Internal Medicine

## 2024-11-27 NOTE — Progress Notes (Unsigned)
 [facility] OFFICE PROGRESS NOTE  Patient Care Team: Nanci Senior, MD as PCP - General (Family Medicine)  Delshawn is a 79 y.o.male with history of rheumatoid arthritis on methotrexate, right sided renal cell carcinoma being [facility] right RCC.   Initial diagnosis: 2019 pT3a G3 18.8 cm right side ccRCC margins negative CT from [facility] records showed multiple small pulmonary nodules increasing slowly.  Largest nodule reported 0.9 cm from CT in July 2025.  Several other bilateral nodules reported. Assessment & Plan   No orders of the defined types were placed in this encounter.    Pauletta JAYSON Chihuahua, MD  INTERVAL HISTORY: Patient returns for follow-up.  Oncology History   No problem history exists.     PHYSICAL EXAMINATION: ECOG PERFORMANCE STATUS: {CHL ONC ECOG PS:[id number]}  There were no vitals filed for this visit. There were no vitals filed for this visit.  GENERAL: alert, no distress and comfortable SKIN: skin color normal and no jaundice or bruising or petechiae on exposed skin EYES: normal, sclera clear OROPHARYNX: no exudate  NECK: No palpable mass LYMPH:  no palpable cervical, axillary lymphadenopathy  LUNGS: clear to auscultation and no wheeze or rales with normal breathing effort HEART: regular rate & rhythm  ABDOMEN: abdomen soft, non-tender and nondistended. Musculoskeletal: no edema NEURO: no focal motor/sensory deficits  Relevant data reviewed during this visit included labs.  New labs ordered.

## 2024-12-02 ENCOUNTER — Ambulatory Visit

## 2024-12-05 ENCOUNTER — Inpatient Hospital Stay
